# Patient Record
Sex: Male | Born: 1950 | Race: Black or African American | Hispanic: No | Marital: Married | Smoking: Never smoker
Health system: Southern US, Community
[De-identification: ages and names within clinical notes are randomized; demographics above are authoritative.]

## PROBLEM LIST (undated history)

## (undated) DIAGNOSIS — D696 Thrombocytopenia, unspecified: Secondary | ICD-10-CM

## (undated) DIAGNOSIS — N189 Chronic kidney disease, unspecified: Secondary | ICD-10-CM

## (undated) DIAGNOSIS — D509 Iron deficiency anemia, unspecified: Secondary | ICD-10-CM

## (undated) DIAGNOSIS — M069 Rheumatoid arthritis, unspecified: Secondary | ICD-10-CM

## (undated) DIAGNOSIS — C8590 Non-Hodgkin lymphoma, unspecified, unspecified site: Secondary | ICD-10-CM

---

## 2015-06-29 DIAGNOSIS — C9110 Chronic lymphocytic leukemia of B-cell type not having achieved remission: Secondary | ICD-10-CM

## 2015-06-29 HISTORY — DX: Chronic lymphocytic leukemia of B-cell type not having achieved remission: C91.10

## 2019-11-05 DIAGNOSIS — I2699 Other pulmonary embolism without acute cor pulmonale: Secondary | ICD-10-CM

## 2019-11-05 HISTORY — DX: Other pulmonary embolism without acute cor pulmonale: I26.99

## 2019-11-17 ENCOUNTER — Inpatient Hospital Stay (HOSPITAL_COMMUNITY): Payer: Medicare Other

## 2019-11-17 ENCOUNTER — Inpatient Hospital Stay (HOSPITAL_COMMUNITY)
Admission: AD | Admit: 2019-11-17 | Discharge: 2019-11-27 | DRG: 871 | Disposition: E | Payer: Medicare Other | Source: Other Acute Inpatient Hospital | Attending: Pulmonary Disease | Admitting: Pulmonary Disease

## 2019-11-17 DIAGNOSIS — C9110 Chronic lymphocytic leukemia of B-cell type not having achieved remission: Secondary | ICD-10-CM | POA: Diagnosis present

## 2019-11-17 DIAGNOSIS — E8770 Fluid overload, unspecified: Secondary | ICD-10-CM | POA: Diagnosis not present

## 2019-11-17 DIAGNOSIS — I2699 Other pulmonary embolism without acute cor pulmonale: Secondary | ICD-10-CM | POA: Diagnosis present

## 2019-11-17 DIAGNOSIS — U071 COVID-19: Secondary | ICD-10-CM | POA: Diagnosis present

## 2019-11-17 DIAGNOSIS — E875 Hyperkalemia: Secondary | ICD-10-CM | POA: Diagnosis present

## 2019-11-17 DIAGNOSIS — Z4659 Encounter for fitting and adjustment of other gastrointestinal appliance and device: Secondary | ICD-10-CM

## 2019-11-17 DIAGNOSIS — R579 Shock, unspecified: Secondary | ICD-10-CM

## 2019-11-17 DIAGNOSIS — Z66 Do not resuscitate: Secondary | ICD-10-CM | POA: Diagnosis not present

## 2019-11-17 DIAGNOSIS — J80 Acute respiratory distress syndrome: Secondary | ICD-10-CM | POA: Diagnosis present

## 2019-11-17 DIAGNOSIS — M069 Rheumatoid arthritis, unspecified: Secondary | ICD-10-CM | POA: Diagnosis present

## 2019-11-17 DIAGNOSIS — Z978 Presence of other specified devices: Secondary | ICD-10-CM

## 2019-11-17 DIAGNOSIS — N170 Acute kidney failure with tubular necrosis: Secondary | ICD-10-CM | POA: Diagnosis present

## 2019-11-17 DIAGNOSIS — J9601 Acute respiratory failure with hypoxia: Secondary | ICD-10-CM | POA: Diagnosis not present

## 2019-11-17 DIAGNOSIS — J1282 Pneumonia due to coronavirus disease 2019: Secondary | ICD-10-CM

## 2019-11-17 DIAGNOSIS — J9622 Acute and chronic respiratory failure with hypercapnia: Secondary | ICD-10-CM

## 2019-11-17 DIAGNOSIS — A4189 Other specified sepsis: Secondary | ICD-10-CM | POA: Diagnosis present

## 2019-11-17 DIAGNOSIS — D638 Anemia in other chronic diseases classified elsewhere: Secondary | ICD-10-CM | POA: Diagnosis present

## 2019-11-17 DIAGNOSIS — E162 Hypoglycemia, unspecified: Secondary | ICD-10-CM | POA: Diagnosis not present

## 2019-11-17 DIAGNOSIS — N179 Acute kidney failure, unspecified: Secondary | ICD-10-CM

## 2019-11-17 DIAGNOSIS — D696 Thrombocytopenia, unspecified: Secondary | ICD-10-CM | POA: Diagnosis present

## 2019-11-17 DIAGNOSIS — C7951 Secondary malignant neoplasm of bone: Secondary | ICD-10-CM | POA: Diagnosis present

## 2019-11-17 DIAGNOSIS — N189 Chronic kidney disease, unspecified: Secondary | ICD-10-CM | POA: Diagnosis present

## 2019-11-17 DIAGNOSIS — R6521 Severe sepsis with septic shock: Secondary | ICD-10-CM | POA: Diagnosis present

## 2019-11-17 DIAGNOSIS — J9600 Acute respiratory failure, unspecified whether with hypoxia or hypercapnia: Secondary | ICD-10-CM

## 2019-11-17 DIAGNOSIS — Z8572 Personal history of non-Hodgkin lymphomas: Secondary | ICD-10-CM | POA: Diagnosis not present

## 2019-11-17 DIAGNOSIS — E872 Acidosis: Secondary | ICD-10-CM | POA: Diagnosis present

## 2019-11-17 HISTORY — DX: Iron deficiency anemia, unspecified: D50.9

## 2019-11-17 HISTORY — DX: Rheumatoid arthritis, unspecified: M06.9

## 2019-11-17 HISTORY — DX: Non-Hodgkin lymphoma, unspecified, unspecified site: C85.90

## 2019-11-17 HISTORY — DX: Chronic kidney disease, unspecified: N18.9

## 2019-11-17 HISTORY — DX: Thrombocytopenia, unspecified: D69.6

## 2019-11-17 LAB — GLUCOSE, CAPILLARY: Glucose-Capillary: 116 mg/dL — ABNORMAL HIGH (ref 70–99)

## 2019-11-17 MED ORDER — NOREPINEPHRINE 4 MG/250ML-% IV SOLN
0.0000 ug/min | INTRAVENOUS | Status: DC
  Filled 2019-11-17: qty 250

## 2019-11-17 MED ORDER — CHLORHEXIDINE GLUCONATE 0.12% ORAL RINSE (MEDLINE KIT)
15.0000 mL | Freq: Two times a day (BID) | OROMUCOSAL | Status: DC
  Administered 2019-11-17 – 2019-11-19 (×4): 15 mL via OROMUCOSAL

## 2019-11-17 MED ORDER — ORAL CARE MOUTH RINSE
15.0000 mL | OROMUCOSAL | Status: DC
  Administered 2019-11-18 – 2019-11-19 (×18): 15 mL via OROMUCOSAL

## 2019-11-17 MED ORDER — NOREPINEPHRINE 4 MG/250ML-% IV SOLN
INTRAVENOUS | Status: AC
  Administered 2019-11-17: 4 mg via INTRAVENOUS
  Filled 2019-11-17: qty 250

## 2019-11-17 MED ORDER — FENTANYL 2500MCG IN NS 250ML (10MCG/ML) PREMIX INFUSION
0.0000 ug/h | INTRAVENOUS | Status: DC
  Administered 2019-11-18: 50 ug/h via INTRAVENOUS
  Administered 2019-11-19: 100 ug/h via INTRAVENOUS
  Filled 2019-11-17 (×2): qty 250

## 2019-11-17 MED ORDER — EPINEPHRINE HCL 5 MG/250ML IV SOLN IN NS
INTRAVENOUS | Status: AC
  Administered 2019-11-17: 2 ug/min via INTRAVENOUS
  Administered 2019-11-18: 2 ug/min
  Filled 2019-11-17: qty 250

## 2019-11-17 MED ORDER — VASOPRESSIN 20 UNIT/ML IV SOLN
0.0300 [IU]/min | INTRAVENOUS | Status: DC
  Administered 2019-11-18 (×2): 0.03 [IU]/min via INTRAVENOUS
  Filled 2019-11-17 (×3): qty 2

## 2019-11-17 MED ORDER — CHLORHEXIDINE GLUCONATE CLOTH 2 % EX PADS
6.0000 | MEDICATED_PAD | Freq: Every day | CUTANEOUS | Status: DC
  Administered 2019-11-17: 6 via TOPICAL

## 2019-11-17 NOTE — Consult Note (Signed)
Renal Service Consult Note [facility]  Craig Allen 11/08/2019 Craig Allen Requesting Physician:  Dr Craig Allen  Reason for Consult:  Renal failure HPI: The patient is a 69 y.o. year-old w/ hx of RA, CLL admitted to hospital in [place], [place] about 12 days ago for COVID+ pna.  Pt got worse over the last week or so and was moved to ICU. Renal function worsened from creat 1.2 on 5/15 to B/Cr 105 and 5.3 today. Pt had HD x 1 yesterday on 5/22 w/o good results due to shock and hypotension. Pt has been getting IV abx w/ vanc/ cefepime. Pt was transferred to [facility] for CRRT.    Pt is not responsive. No hx obtained from the patient.    UA on 5/15 was mostly negative and the same w/ UA on 5/22 25 wbc and 3-5 rbc.   ROS n/a   Past Medical History No past medical history on file. Past Surgical History  Family History No family history on file. Social History  has no history on file for tobacco, alcohol, and drug. Allergies Not on File Home medications Prior to Admission medications   Not on File     Vitals:   11/02/2019 2302 11/24/2019 2310 10/27/2019 2311 11/12/2019 2327  BP:   117/70   Pulse:   (!) 115   Resp:   (!) 27   Temp:  98.3 F (36.8 C)    TempSrc:  Axillary    SpO2:   90%   Weight:    93.8 kg  Height: 5\' 8"  (1.727 m)      Exam Gen on vent, sedated No rash, cyanosis or gangrene Sclera anicteric, throat w ETT  No jvd or bruits Chest clear bilat to bases, good air movement, no rales or wheezing RRR no MRG, prominent heart sounds Abd soft ntnd no mass or ascites +bs GU normal male w/ foley in place MS no joint effusions or deformity Ext diffuse 2+ pitting edema of the legs and arms, no wounds or ulcers noted Neuro is sedated on the vent    Assessment/ Plan: 1. Acute renal failure - in patient w/ hx of RA and CLL admitted for COVID pna about 12 days ago and has had progressive decline in renal function over the last 1 week. Pt had 1 HD on 5/22 which was not  successful due to shock. Pt on pressors now. CXR's showing pulm edema according to outside notes but we don't have those films available. Pt has AKI , probable ATN. ABG here shows severe acidemia. Plan CRRT will use bicarb pre/post fluids for now. Will plan to try to remove volume 50-75 cc/hr to start, as tolerated. Pt on IV heparin gtt.  2. COVID-19 PNA/ resp failure - on vent per CCM 3. Vol overload 4. H/o RA 5. H/o CLL      Craig Allen Lad  MD 10/28/2019, 11:56 PM  No results for input(s): WBC, HGB in the last 168 hours. No results for input(s): K, BUN, CREATININE, CALCIUM, PHOS in the last 168 hours.

## 2019-11-17 NOTE — Progress Notes (Signed)
eLink Physician-Brief Progress Note Patient Name: Craig Allen DOB: 10/10/1950 MRN: [id number]   Date of Service  11/14/2019  HPI/Events of Note  69 yo male transferred from [place], [place] with COVID pneumonia, sepsis, hypotension and renal failure for CRRT. Nursing has already ordered a portable CXR STAT.   eICU Interventions  Will order: 1. Ventilator settings: 100%PRVC 20/TV 540/P 12. 2. ABG now.  3. Fentanyl IV infusion. Titrate to RASS = 0 to -1.  4. Norepinephrine IV infusion. Titrate to MAP >= 65. 5. Vasopressin IV infusion at shock dose.      Intervention Category Evaluation Type: New Patient Evaluation  Lysle Dingwall 11/18/2019, 11:47 PM

## 2019-11-17 NOTE — Progress Notes (Signed)
Report taken from [facility] from [place], [place].  Wifes name is Esmael Zapien (c) [phone]. Unknown ETA.

## 2019-11-18 ENCOUNTER — Inpatient Hospital Stay (HOSPITAL_COMMUNITY): Payer: Medicare Other

## 2019-11-18 ENCOUNTER — Other Ambulatory Visit: Payer: Self-pay

## 2019-11-18 ENCOUNTER — Encounter (HOSPITAL_COMMUNITY): Payer: Self-pay | Admitting: Emergency Medicine

## 2019-11-18 DIAGNOSIS — R6521 Severe sepsis with septic shock: Secondary | ICD-10-CM

## 2019-11-18 DIAGNOSIS — U071 COVID-19: Secondary | ICD-10-CM | POA: Diagnosis present

## 2019-11-18 DIAGNOSIS — N179 Acute kidney failure, unspecified: Secondary | ICD-10-CM

## 2019-11-18 DIAGNOSIS — J1282 Pneumonia due to coronavirus disease 2019: Secondary | ICD-10-CM

## 2019-11-18 DIAGNOSIS — R579 Shock, unspecified: Secondary | ICD-10-CM

## 2019-11-18 DIAGNOSIS — J9601 Acute respiratory failure with hypoxia: Secondary | ICD-10-CM

## 2019-11-18 DIAGNOSIS — N170 Acute kidney failure with tubular necrosis: Secondary | ICD-10-CM

## 2019-11-18 LAB — POCT I-STAT 7, (LYTES, BLD GAS, ICA,H+H)
Acid-base deficit: 13 mmol/L — ABNORMAL HIGH (ref 0.0–2.0)
Acid-base deficit: 14 mmol/L — ABNORMAL HIGH (ref 0.0–2.0)
Acid-base deficit: 3 mmol/L — ABNORMAL HIGH (ref 0.0–2.0)
Acid-base deficit: 4 mmol/L — ABNORMAL HIGH (ref 0.0–2.0)
Acid-base deficit: 8 mmol/L — ABNORMAL HIGH (ref 0.0–2.0)
Bicarbonate: 15.6 mmol/L — ABNORMAL LOW (ref 20.0–28.0)
Bicarbonate: 16.6 mmol/L — ABNORMAL LOW (ref 20.0–28.0)
Bicarbonate: 20.3 mmol/L (ref 20.0–28.0)
Bicarbonate: 23.6 mmol/L (ref 20.0–28.0)
Bicarbonate: 24.8 mmol/L (ref 20.0–28.0)
Calcium, Ion: 0.95 mmol/L — ABNORMAL LOW (ref 1.15–1.40)
Calcium, Ion: 0.95 mmol/L — ABNORMAL LOW (ref 1.15–1.40)
Calcium, Ion: 0.98 mmol/L — ABNORMAL LOW (ref 1.15–1.40)
Calcium, Ion: 1.04 mmol/L — ABNORMAL LOW (ref 1.15–1.40)
Calcium, Ion: 1.05 mmol/L — ABNORMAL LOW (ref 1.15–1.40)
HCT: 27 % — ABNORMAL LOW (ref 39.0–52.0)
HCT: 27 % — ABNORMAL LOW (ref 39.0–52.0)
HCT: 28 % — ABNORMAL LOW (ref 39.0–52.0)
HCT: 28 % — ABNORMAL LOW (ref 39.0–52.0)
HCT: 30 % — ABNORMAL LOW (ref 39.0–52.0)
Hemoglobin: 10.2 g/dL — ABNORMAL LOW (ref 13.0–17.0)
Hemoglobin: 9.2 g/dL — ABNORMAL LOW (ref 13.0–17.0)
Hemoglobin: 9.2 g/dL — ABNORMAL LOW (ref 13.0–17.0)
Hemoglobin: 9.5 g/dL — ABNORMAL LOW (ref 13.0–17.0)
Hemoglobin: 9.5 g/dL — ABNORMAL LOW (ref 13.0–17.0)
O2 Saturation: 67 %
O2 Saturation: 87 %
O2 Saturation: 91 %
O2 Saturation: 93 %
O2 Saturation: 94 %
Patient temperature: 36.2
Patient temperature: 37.7
Patient temperature: 98.3
Patient temperature: 98.3
Patient temperature: 98.4
Potassium: 5.1 mmol/L (ref 3.5–5.1)
Potassium: 5.3 mmol/L — ABNORMAL HIGH (ref 3.5–5.1)
Potassium: 5.4 mmol/L — ABNORMAL HIGH (ref 3.5–5.1)
Potassium: 5.5 mmol/L — ABNORMAL HIGH (ref 3.5–5.1)
Potassium: 5.9 mmol/L — ABNORMAL HIGH (ref 3.5–5.1)
Sodium: 132 mmol/L — ABNORMAL LOW (ref 135–145)
Sodium: 134 mmol/L — ABNORMAL LOW (ref 135–145)
Sodium: 134 mmol/L — ABNORMAL LOW (ref 135–145)
Sodium: 135 mmol/L (ref 135–145)
Sodium: 136 mmol/L (ref 135–145)
TCO2: 17 mmol/L — ABNORMAL LOW (ref 22–32)
TCO2: 18 mmol/L — ABNORMAL LOW (ref 22–32)
TCO2: 22 mmol/L (ref 22–32)
TCO2: 25 mmol/L (ref 22–32)
TCO2: 27 mmol/L (ref 22–32)
pCO2 arterial: 47.5 mmHg (ref 32.0–48.0)
pCO2 arterial: 53 mmHg — ABNORMAL HIGH (ref 32.0–48.0)
pCO2 arterial: 53 mmHg — ABNORMAL HIGH (ref 32.0–48.0)
pCO2 arterial: 61.9 mmHg — ABNORMAL HIGH (ref 32.0–48.0)
pCO2 arterial: 63.4 mmHg — ABNORMAL HIGH (ref 32.0–48.0)
pH, Arterial: 7.042 — CL (ref 7.350–7.450)
pH, Arterial: 7.074 — CL (ref 7.350–7.450)
pH, Arterial: 7.189 — CL (ref 7.350–7.450)
pH, Arterial: 7.2 — ABNORMAL LOW (ref 7.350–7.450)
pH, Arterial: 7.299 — ABNORMAL LOW (ref 7.350–7.450)
pO2, Arterial: 52 mmHg — ABNORMAL LOW (ref 83.0–108.0)
pO2, Arterial: 73 mmHg — ABNORMAL LOW (ref 83.0–108.0)
pO2, Arterial: 74 mmHg — ABNORMAL LOW (ref 83.0–108.0)
pO2, Arterial: 75 mmHg — ABNORMAL LOW (ref 83.0–108.0)
pO2, Arterial: 81 mmHg — ABNORMAL LOW (ref 83.0–108.0)

## 2019-11-18 LAB — RENAL FUNCTION PANEL
Albumin: 2.1 g/dL — ABNORMAL LOW (ref 3.5–5.0)
Albumin: 2.2 g/dL — ABNORMAL LOW (ref 3.5–5.0)
Anion gap: 22 — ABNORMAL HIGH (ref 5–15)
Anion gap: 23 — ABNORMAL HIGH (ref 5–15)
BUN: 83 mg/dL — ABNORMAL HIGH (ref 8–23)
BUN: 65 mg/dL — ABNORMAL HIGH (ref 8–23)
CO2: 19 mmol/L — ABNORMAL LOW (ref 22–32)
CO2: 20 mmol/L — ABNORMAL LOW (ref 22–32)
Calcium: 6.7 mg/dL — ABNORMAL LOW (ref 8.9–10.3)
Calcium: 6.6 mg/dL — ABNORMAL LOW (ref 8.9–10.3)
Chloride: 103 mmol/L (ref 98–111)
Chloride: 98 mmol/L (ref 98–111)
Creatinine, Ser: 4.4 mg/dL — ABNORMAL HIGH (ref 0.61–1.24)
Creatinine, Ser: 3.44 mg/dL — ABNORMAL HIGH (ref 0.61–1.24)
GFR calc Af Amer: 15 mL/min — ABNORMAL LOW (ref >60)
GFR calc Af Amer: 20 mL/min — ABNORMAL LOW (ref >60)
GFR calc non Af Amer: 13 mL/min — ABNORMAL LOW (ref >60)
GFR calc non Af Amer: 17 mL/min — ABNORMAL LOW (ref >60)
Glucose, Bld: 153 mg/dL — ABNORMAL HIGH (ref 70–99)
Glucose, Bld: 165 mg/dL — ABNORMAL HIGH (ref 70–99)
Phosphorus: 7.6 mg/dL — ABNORMAL HIGH (ref 2.5–4.6)
Phosphorus: 6.5 mg/dL — ABNORMAL HIGH (ref 2.5–4.6)
Potassium: 5.6 mmol/L — ABNORMAL HIGH (ref 3.5–5.1)
Potassium: 5.3 mmol/L — ABNORMAL HIGH (ref 3.5–5.1)
Sodium: 144 mmol/L (ref 135–145)
Sodium: 141 mmol/L (ref 135–145)

## 2019-11-18 LAB — CBC
HCT: 25.2 % — ABNORMAL LOW (ref 39.0–52.0)
Hemoglobin: 8.4 g/dL — ABNORMAL LOW (ref 13.0–17.0)
MCH: 28.8 pg (ref 26.0–34.0)
MCHC: 33.3 g/dL (ref 30.0–36.0)
MCV: 86.3 fL (ref 80.0–100.0)
Platelets: 82 10*3/uL — ABNORMAL LOW (ref 150–400)
RBC: 2.92 MIL/uL — ABNORMAL LOW (ref 4.22–5.81)
RDW: 16.2 % — ABNORMAL HIGH (ref 11.5–15.5)
WBC: 27.9 10*3/uL — ABNORMAL HIGH (ref 4.0–10.5)
nRBC: 0.6 % — ABNORMAL HIGH (ref 0.0–0.2)

## 2019-11-18 LAB — GLUCOSE, CAPILLARY
Glucose-Capillary: 136 mg/dL — ABNORMAL HIGH (ref 70–99)
Glucose-Capillary: 147 mg/dL — ABNORMAL HIGH (ref 70–99)
Glucose-Capillary: 147 mg/dL — ABNORMAL HIGH (ref 70–99)
Glucose-Capillary: 149 mg/dL — ABNORMAL HIGH (ref 70–99)
Glucose-Capillary: 149 mg/dL — ABNORMAL HIGH (ref 70–99)
Glucose-Capillary: 149 mg/dL — ABNORMAL HIGH (ref 70–99)
Glucose-Capillary: 156 mg/dL — ABNORMAL HIGH (ref 70–99)
Glucose-Capillary: 70 mg/dL (ref 70–99)

## 2019-11-18 LAB — URINALYSIS, ROUTINE W REFLEX MICROSCOPIC
Bilirubin Urine: NEGATIVE
Glucose, UA: 100 mg/dL — AB
Ketones, ur: 15 mg/dL — AB
Leukocytes,Ua: NEGATIVE
Nitrite: NEGATIVE
Protein, ur: >300 mg/dL — AB
Specific Gravity, Urine: >1.03 — ABNORMAL HIGH (ref 1.005–1.030)
pH: 5 (ref 5.0–8.0)

## 2019-11-18 LAB — MRSA PCR SCREENING: MRSA by PCR: NEGATIVE

## 2019-11-18 LAB — CREATININE, URINE, RANDOM: Creatinine, Urine: 143.32 mg/dL

## 2019-11-18 LAB — APTT
aPTT: 87 seconds — ABNORMAL HIGH (ref 24–36)
aPTT: 93 seconds — ABNORMAL HIGH (ref 24–36)
aPTT: 78 seconds — ABNORMAL HIGH (ref 24–36)
aPTT: 73 seconds — ABNORMAL HIGH (ref 24–36)

## 2019-11-18 LAB — LACTIC ACID, PLASMA: Lactic Acid, Venous: 7.4 mmol/L (ref 0.5–1.9)

## 2019-11-18 LAB — URINALYSIS, MICROSCOPIC (REFLEX): WBC, UA: NONE SEEN WBC/hpf (ref 0–5)

## 2019-11-18 LAB — ECHOCARDIOGRAM COMPLETE
Height: 68 in
Weight: 3308.66 oz

## 2019-11-18 LAB — SODIUM, URINE, RANDOM: Sodium, Ur: 44 mmol/L

## 2019-11-18 LAB — HEMOGLOBIN A1C
Hgb A1c MFr Bld: 6.8 % — ABNORMAL HIGH (ref 4.8–5.6)
Mean Plasma Glucose: 148.46 mg/dL

## 2019-11-18 LAB — VANCOMYCIN, RANDOM: Vancomycin Rm: 10

## 2019-11-18 LAB — HEPARIN LEVEL (UNFRACTIONATED): Heparin Unfractionated: 0.6 IU/mL (ref 0.30–0.70)

## 2019-11-18 LAB — MAGNESIUM: Magnesium: 1.8 mg/dL (ref 1.7–2.4)

## 2019-11-18 LAB — CK: Total CK: 258 U/L (ref 49–397)

## 2019-11-18 LAB — BRAIN NATRIURETIC PEPTIDE: B Natriuretic Peptide: 62 pg/mL (ref 0.0–100.0)

## 2019-11-18 MED ORDER — STERILE WATER FOR INJECTION IV SOLN
INTRAVENOUS | Status: DC
  Filled 2019-11-18 (×12): qty 150

## 2019-11-18 MED ORDER — SODIUM CHLORIDE 0.9% FLUSH: 3.0000 mL | INTRAVENOUS | Status: DC | PRN

## 2019-11-18 MED ORDER — CHLORHEXIDINE GLUCONATE CLOTH 2 % EX PADS: 6.0000 | MEDICATED_PAD | Freq: Every day | CUTANEOUS | Status: DC

## 2019-11-18 MED ORDER — EPINEPHRINE HCL 5 MG/250ML IV SOLN IN NS: 0.5000 ug/min | INTRAVENOUS | Status: DC

## 2019-11-18 MED ORDER — STERILE WATER FOR INJECTION IV SOLN
150.0000 mL/h | INTRAVENOUS | Status: DC
  Administered 2019-11-18 (×2): 150 mL/h via INTRAVENOUS_CENTRAL
  Filled 2019-11-18 (×7): qty 150

## 2019-11-18 MED ORDER — MIDAZOLAM 50MG/50ML (1MG/ML) PREMIX INFUSION
0.0000 mg/h | INTRAVENOUS | Status: DC
  Administered 2019-11-18: 5 mg/h via INTRAVENOUS
  Administered 2019-11-18 – 2019-11-19 (×3): 6 mg/h via INTRAVENOUS
  Filled 2019-11-18 (×4): qty 50

## 2019-11-18 MED ORDER — ARGATROBAN 50 MG/50ML IV SOLN
0.4000 ug/kg/min | INTRAVENOUS | Status: DC
  Administered 2019-11-18 (×2): 0.5 ug/kg/min via INTRAVENOUS
  Administered 2019-11-19: 0.4 ug/kg/min via INTRAVENOUS
  Filled 2019-11-18 (×3): qty 50

## 2019-11-18 MED ORDER — SODIUM BICARBONATE 8.4 % IV SOLN
INTRAVENOUS | Status: AC
  Administered 2019-11-18: 100 meq via INTRAVENOUS
  Filled 2019-11-18: qty 100

## 2019-11-18 MED ORDER — SODIUM CHLORIDE 0.9 % IV SOLN
0.0000 ug/kg/min | INTRAVENOUS | Status: DC
  Administered 2019-11-18: 5 ug/kg/min via INTRAVENOUS
  Administered 2019-11-18: 4 ug/kg/min via INTRAVENOUS
  Administered 2019-11-19 (×3): 7 ug/kg/min via INTRAVENOUS
  Filled 2019-11-18 (×9): qty 20

## 2019-11-18 MED ORDER — STERILE WATER FOR INJECTION IV SOLN
INTRAVENOUS | Status: DC
  Filled 2019-11-18 (×6): qty 150

## 2019-11-18 MED ORDER — CISATRACURIUM BOLUS VIA INFUSION
0.1000 mg/kg | Freq: Once | INTRAVENOUS | Status: AC
  Administered 2019-11-18: 9.4 mg via INTRAVENOUS
  Filled 2019-11-18: qty 10

## 2019-11-18 MED ORDER — STERILE WATER FOR INJECTION IV SOLN
INTRAVENOUS | Status: DC
  Filled 2019-11-18 (×11): qty 150

## 2019-11-18 MED ORDER — SODIUM BICARBONATE 8.4 % IV SOLN: 100.0000 meq | Freq: Once | INTRAVENOUS | Status: AC

## 2019-11-18 MED ORDER — HEPARIN SODIUM (PORCINE) 1000 UNIT/ML DIALYSIS: 1000.0000 [IU] | INTRAMUSCULAR | Status: DC | PRN

## 2019-11-18 MED ORDER — PRISMASOL BGK 4/2.5 32-4-2.5 MEQ/L IV SOLN
INTRAVENOUS | Status: DC
  Filled 2019-11-18 (×28): qty 5000

## 2019-11-18 MED ORDER — SODIUM CHLORIDE 0.9 % FOR CRRT: INTRAVENOUS_CENTRAL | Status: DC | PRN

## 2019-11-18 MED ORDER — DEXTROSE 50 % IV SOLN
INTRAVENOUS | Status: AC
  Administered 2019-11-18: 50 mL
  Filled 2019-11-18: qty 50

## 2019-11-18 MED ORDER — HEPARIN (PORCINE) 25000 UT/250ML-% IV SOLN
800.0000 [IU]/h | INTRAVENOUS | Status: DC
  Administered 2019-11-18: 950 [IU]/h via INTRAVENOUS
  Filled 2019-11-18: qty 250

## 2019-11-18 MED ORDER — SODIUM CHLORIDE 0.9% FLUSH
3.0000 mL | Freq: Two times a day (BID) | INTRAVENOUS | Status: DC
  Administered 2019-11-18 – 2019-11-19 (×2): 3 mL via INTRAVENOUS

## 2019-11-18 MED ORDER — NOREPINEPHRINE 16 MG/250ML-% IV SOLN
0.0000 ug/min | INTRAVENOUS | Status: DC
  Administered 2019-11-18: 35 ug/min via INTRAVENOUS
  Administered 2019-11-18: 40 ug/min via INTRAVENOUS
  Administered 2019-11-18: 30 ug/min via INTRAVENOUS
  Administered 2019-11-19 (×2): 40 ug/min via INTRAVENOUS
  Filled 2019-11-18 (×6): qty 250

## 2019-11-18 MED ORDER — ARTIFICIAL TEARS OPHTHALMIC OINT
1.0000 "application " | TOPICAL_OINTMENT | Freq: Three times a day (TID) | OPHTHALMIC | Status: DC
  Administered 2019-11-18 – 2019-11-19 (×6): 1 via OPHTHALMIC
  Filled 2019-11-18: qty 3.5

## 2019-11-18 MED ORDER — DOCUSATE SODIUM 50 MG/5ML PO LIQD
100.0000 mg | Freq: Two times a day (BID) | ORAL | Status: DC
  Administered 2019-11-18 (×3): 100 mg via ORAL
  Filled 2019-11-18 (×3): qty 10

## 2019-11-18 MED ORDER — ALTEPLASE 2 MG IJ SOLR: 2.0000 mg | Freq: Once | INTRAMUSCULAR | Status: DC | PRN

## 2019-11-18 MED ORDER — SODIUM BICARBONATE 8.4 % IV SOLN
100.0000 meq | Freq: Once | INTRAVENOUS | Status: AC
  Administered 2019-11-18: 100 meq via INTRAVENOUS
  Filled 2019-11-18: qty 100

## 2019-11-18 MED ORDER — PANTOPRAZOLE SODIUM 40 MG IV SOLR
40.0000 mg | Freq: Every day | INTRAVENOUS | Status: DC
  Administered 2019-11-18 (×2): 40 mg via INTRAVENOUS
  Filled 2019-11-18 (×2): qty 40

## 2019-11-18 MED ORDER — INSULIN ASPART 100 UNIT/ML ~~LOC~~ SOLN: 0.0000 [IU] | SUBCUTANEOUS | Status: DC

## 2019-11-18 MED ORDER — SODIUM CHLORIDE 0.9 % IV SOLN: 250.0000 mL | INTRAVENOUS | Status: DC | PRN

## 2019-11-18 MED ORDER — POLYETHYLENE GLYCOL 3350 17 G PO PACK
17.0000 g | PACK | Freq: Every day | ORAL | Status: DC
  Administered 2019-11-18: 17 g via ORAL
  Filled 2019-11-18: qty 1

## 2019-11-18 MED ORDER — EPINEPHRINE HCL 5 MG/250ML IV SOLN IN NS
0.5000 ug/min | INTRAVENOUS | Status: DC
  Administered 2019-11-18: 18 ug/min via INTRAVENOUS
  Administered 2019-11-18: 14 ug/min via INTRAVENOUS
  Administered 2019-11-19: 20 ug/min via INTRAVENOUS
  Administered 2019-11-19: 17 ug/min via INTRAVENOUS
  Administered 2019-11-19: 20 ug/min via INTRAVENOUS
  Administered 2019-11-19: 10 ug/min via INTRAVENOUS
  Filled 2019-11-18 (×5): qty 250

## 2019-11-18 MED ORDER — SODIUM CHLORIDE 0.9 % IV SOLN
2.0000 g | Freq: Two times a day (BID) | INTRAVENOUS | Status: DC
  Administered 2019-11-18 – 2019-11-19 (×4): 2 g via INTRAVENOUS
  Filled 2019-11-18 (×4): qty 2

## 2019-11-18 NOTE — Progress Notes (Addendum)
ANTICOAGULATION CONSULT NOTE - Initial Consult  Pharmacy Consult for Argatroban (discontinuing IV Heparin Indication: pulmonary embolus - suspected HIT  Allergies  Allergen Reactions  . Heparin     Suspected HIT - labs inprocess    Patient Measurements: Height: 5' 8"  (172.7 cm) Weight: 93.8 kg (206 lb 12.7 oz) IBW/kg (Calculated) : 68.4 Heparin Dosing Weight: 88 kg   Vital Signs: Temp: 97.2 F (36.2 C) (05/23 0915) Temp Source: Core (05/23 0800) BP: 114/78 (05/23 0915) Pulse Rate: 104 (05/23 0915)  Labs: Recent Labs    11/18/19 0349 11/18/19 0349 11/18/19 0453 11/18/19 0557  HGB 9.5*   < > 8.4* 9.2*  HCT 28.0*  --  25.2* 27.0*  PLT  --   --  82*  --   APTT  --   --  87*  --   HEPARINUNFRC  --   --  0.60  --   CREATININE  --   --  4.40*  --    < > = values in this interval not displayed.    Estimated Creatinine Clearance: 17.6 mL/min (A) (by C-G formula based on SCr of 4.4 mg/dL (H)).   Medical History: History reviewed. No pertinent past medical history.  Medications:  Scheduled:  . artificial tears  1 application Both Eyes H4V  . chlorhexidine gluconate (MEDLINE KIT)  15 mL Mouth Rinse BID  . [START ON Dec 04, 2019] Chlorhexidine Gluconate Cloth  6 each Topical Q0600  . docusate  100 mg Oral BID  . insulin aspart  0-6 Units Subcutaneous Q4H  . mouth rinse  15 mL Mouth Rinse 10 times per day  . pantoprazole (PROTONIX) IV  40 mg Intravenous QHS  . polyethylene glycol  17 g Oral Daily  . sodium chloride flush  3 mL Intravenous Q12H    Assessment: 39 yom transferred from [facility] for CRRT initiation after found to have COVID 19 and acute, non-occlusive PE of LUL. Was started on apixaban (LD 5/18) then transitioned to heparin infusion (running at 800 units/hr on arrival).    Platelets down to 82 today. (Plts of 256 on 5/18 when heparin was restarted at [facility]. Patient was exposed to IV Heparin 5/9 to 5/11 then changed to Eliquis (last dose 5/18) then  back to IV Heparin on 5/18 per [facility] notes. Concern for possible HIT - labs sent and changing to Argatroban.   Goal of Therapy:  aPTT 50-90 seconds Monitor platelets by anticoagulation protocol: Yes   Plan:  Discontinue IV Heparin.  Start Argatroban at 0.5 mcg/kg/min APTT in 2 hours.    Sloan Leiter, PharmD, BCPS, BCCCP Clinical Pharmacist Please refer to [facility] for [place] numbers 11/18/2019 9:31 AM  Addendum:  APTT 93 on Argatroban 0.5 mcg/kg/min.   Plan: Reduce Argatroban to 0.4 mcg/kg/min.  Recheck aPTT in 2 hours.   Sloan Leiter, PharmD, BCPS, BCCCP Clinical Pharmacist 11/18/2019, 1:40 PM

## 2019-11-18 NOTE — Progress Notes (Signed)
CRITICAL VALUE ALERT  Critical Value:  Lactic acid=7.4  Date & Time Notied:  11/18/19  0230  Provider Notified: elink  Orders Received/Actions taken: awaiting new orders

## 2019-11-18 NOTE — Progress Notes (Signed)
ANTICOAGULATION CONSULT NOTE - Initial Consult  Pharmacy Consult for heparin Indication: pulmonary embolus  Not on File  Patient Measurements: Height: _0  (172.7 cm) Weight: 93.8 kg (206 lb 12.7 oz) IBW/kg (Calculated) : 68.4 Heparin Dosing Weight: 88 kg   Vital Signs: Temp: 99.3 F (37.4 C) (05/23 0000) Temp Source: Bladder (05/23 0000) BP: 134/67 (05/23 0000) Pulse Rate: 118 (05/23 0000)  Labs: Recent Labs    11/18/19 0004  HGB 9.5*  HCT 28.0*    CrCl cannot be calculated (No successful lab value found.).   Medical History: No past medical history on file.  Medications:  Scheduled:  . artificial tears  1 application Both Eyes A4S  . chlorhexidine gluconate (MEDLINE KIT)  15 mL Mouth Rinse BID  . Chlorhexidine Gluconate Cloth  6 each Topical Q0600  . cisatracurium  0.1 mg/kg Intravenous Once  . docusate  100 mg Oral BID  . mouth rinse  15 mL Mouth Rinse 10 times per day  . polyethylene glycol  17 g Oral Daily    Assessment: 69 yom transferred from [facility] for CRRT initiation after found to have COVID 19 and acute, non-occlusive PE of LUL. Was started on apixaban (LD 5/18) then transitioned to heparin infusion (running at 800 units/hr on arrival).   Initial heparin level here is 0.6, aPTT is 87 - currently correlating. Hgb 9.5. No s/sx of bleeding or infusion issues per nursing.   Goal of Therapy:  Heparin level 0.3-0.7 units/ml aPTT 66-102 seconds Monitor platelets by anticoagulation protocol: Yes   Plan:  Continue heparin infusion at 800 units/hr  Order heparin level in 8 hours  Monitor daily HL, CBC, and for s/sx of bleeding  Antonietta Jewel, PharmD, BCCCP Clinical Pharmacist  11/18/2019 12:50 AM  Please check AMION for all [place] phone numbers After 10:00 PM, call [place] [phone]

## 2019-11-18 NOTE — Progress Notes (Signed)
RT attempted x 2 to place a radial A-line.  Good blood flow obtained both times but unable to tread the catheter.  MD notified.

## 2019-11-18 NOTE — Progress Notes (Signed)
eLink Physician-Brief Progress Note Patient Name: Craig Allen DOB: 10/10/1950 MRN: [id number]   Date of Service  11/18/2019  HPI/Events of Note  ABG on 100%/PRVC 35/TV 540/P 12 = 7.18/53.0/75.0  eICU Interventions  Will order: 1. NaHCO3 100 meq IV now x 1.  2. Repeat ABG at 6 AM.      Intervention Category Major Interventions: Acid-Base disturbance - evaluation and management;Respiratory failure - evaluation and management  Lysle Dingwall 11/18/2019, 4:17 AM

## 2019-11-18 NOTE — Progress Notes (Addendum)
[facility] Progress Note  Subjective: did not do well overnight, unable to UF fluid to due worsening shock/ hypotension  Vitals:   11/18/19 1715 11/18/19 1730 11/18/19 1745 11/18/19 1800  BP: 119/82 112/76 111/80 106/73  Pulse:  (!) 104 (!) 103 (!) 101  Resp: (!) 35 (!) 35 (!) 35 (!) 35  Temp: (!) 97.5 F (36.4 C) (!) 97.5 F (36.4 C) (!) 97.5 F (36.4 C) 97.7 F (36.5 C)  TempSrc:      SpO2:  94% 90% 91%  Weight:      Height:        Exam: Gen on vent, sedated No rash, cyanosis or gangrene Sclera anicteric, throat w ETT  No jvd or bruits Chest clear bilat to bases, good air movement, no rales or wheezing RRR no MRG, prominent heart sounds Abd soft ntnd no mass or ascites +bs GU normal male w/ foley in place MS no joint effusions or deformity Ext diffuse 2+ pitting edema of the legs and arms, no wounds or ulcers noted Neuro is sedated on the vent  UA 5/23 - >300 prot, large Hb, no wbc, 0-5 rbc  Assessment/ Plan: 1. Acute renal failure - baseline creat around 1.2.  Pt w/ hx of RA and CLL admitted for COVID pna about 12 days ago at outside hospital and has had progressive decline in renal function over the last week. SP 1 HD session there on 5/22 which was not successful due to shock. Pt deteriorated and was started on pressors and tx'd to [facility] 43M ICU.  CXR here showed diffuse infiltrates. ATN suspected as cause of AKI. Pt had severe acidemia so was started on CRRT early am on 5/23. Pt continued to deteriorate overnight w/ addition of multiple pressors and pt was made DNR. We are not able to pull any fluid, I/O's +. Poor prognosis. Cont CRRT for now. Check CPK.  2. COVID-19 PNA/ resp failure - on vent per CCM 3. Vol overload 4. H/o RA 5. H/o CLL      Rob Shyanna Klingel 11/18/2019, 6:23 PM   Recent Labs  Lab 11/18/19 0453 11/18/19 0453 11/18/19 0557 11/18/19 0557 11/18/19 1548 11/18/19 1631  K 5.6*   < > 5.3*   < > 5.3* 5.1  BUN 83*  --   --   --   65*  --   CREATININE 4.40*  --   --   --  3.44*  --   CALCIUM 6.7*  --   --   --  6.6*  --   PHOS 7.6*  --   --   --  6.5*  --   HGB 8.4*   < > 9.2*  --   --  10.2*   < > = values in this interval not displayed.   Inpatient medications: . artificial tears  1 application Both Eyes K2I  . chlorhexidine gluconate (MEDLINE KIT)  15 mL Mouth Rinse BID  . [START ON 12-11-19] Chlorhexidine Gluconate Cloth  6 each Topical Q0600  . docusate  100 mg Oral BID  . insulin aspart  0-6 Units Subcutaneous Q4H  . mouth rinse  15 mL Mouth Rinse 10 times per day  . pantoprazole (PROTONIX) IV  40 mg Intravenous QHS  . polyethylene glycol  17 g Oral Daily  . sodium chloride flush  3 mL Intravenous Q12H   . sodium chloride    . argatroban 0.4 mcg/kg/min (11/18/19 1600)  . ceFEPime (MAXIPIME) IV Stopped (11/18/19 1349)  .  cisatracurium (NIMBEX) infusion 5 mcg/kg/min (11/18/19 1800)  . epinephrine Stopped (11/18/19 1600)  . fentaNYL infusion INTRAVENOUS 100 mcg/hr (11/18/19 1800)  . midazolam 6 mg/hr (11/18/19 1800)  . norepinephrine (LEVOPHED) Adult infusion 35 mcg/min (11/18/19 1817)  . prismasol BGK 4/2.5 2,200 mL/hr at 11/18/19 1554  . sodium bicarbonate (isotonic) 1000 mL infusion 150 mL/hr (11/18/19 0717)  . sodium bicarbonate (isotonic) 1000 mL infusion 150 mL/hr at 11/18/19 1311  . vasopressin (PITRESSIN) infusion - *FOR SHOCK* 0.03 Units/min (11/18/19 1817)   sodium chloride, alteplase, heparin, sodium chloride, sodium chloride flush

## 2019-11-18 NOTE — Progress Notes (Signed)
  Echocardiogram 2D Echocardiogram has been performed.  Michiel Cowboy 11/18/2019, 9:29 AM

## 2019-11-18 NOTE — Progress Notes (Signed)
Assisted tele visit to patient with family member.  Aidyn Kellis M, RN  

## 2019-11-18 NOTE — Progress Notes (Signed)
eLink Physician-Brief Progress Note Patient Name: Craig Allen DOB: 10/10/1950 MRN: [id number]   Date of Service  11/18/2019  HPI/Events of Note  Request to stick feet for blood cultures.   eICU Interventions  Will order: 1. May stick feet.      Intervention Category Major Interventions: Other:  Elanah Osmanovic Cornelia Copa 11/18/2019, 1:25 AM

## 2019-11-18 NOTE — Progress Notes (Addendum)
NAME:  Craig Allen, MRN:  [id number], DOB:  10/10/1950, LOS: 1 ADMISSION DATE:  11/05/2019, CONSULTATION DATE:  11/18/19 REFERRING MD:  Angelina Sheriff, CHIEF COMPLAINT:  Covid-19 PNA  Brief History   69 y.o. M transferred from [place], [place] with Covid-19 PNA, PE and renal failure. Pt had worsening sepsis and hypotension, so was transferred for CRRT  History of present illness   69 y.o. M with PMH of CLL on Ibrutinib, lymphoma, PE and anemia who presented to [facility] in [place], [place] for cough. Found to have Covid-19 as well as acute, non-occlusive PE of the LUL.  He was admitted and given Dexamethasone, Ceftriaxone and Azithromycin and apixaban.  His respiratory status continued to worsen and he was intubated on 5/18 and transferred to intensive care.  Pt's renal function also worsened and he was initiated on dialysis on 5/21, he became increasingly hypotensive and so transfer was requested for CRRT.  At some point was started on Vanc/Cefepime.   Pt arrived on Levophed, Vasopressin and Epinephrin.  Initial ABG 7.07/53/73/15 0.6  Past Medical History  CLL, Lymphoma, PE, anemia, RA  Significant Hospital Events   5/10 Presented to ED in [place], [place] 5/18 Intubated 5/21 HD initiated  5/22 Transfer to [place]:  nephrology  Procedures:  5/18 ETT 5/21 HD catheter  Significant Diagnostic Tests:  5/20 CTA chest>> nonocclusive PE LUL  Micro Data:  5/23 blood cultures x2>> 5/23 respiratory culture>>  Antimicrobials:  Azithromycin 5/10 >> 5/12 Ceftriaxone 5/10 >> 5/14  Vancomycin 5/18 >> 5/23 Cefepime 5/18 >>   Interim history/subjective:   CVVH running, I/O -75cc Fentanyl 75, Versed 5, norepinephrine 40, epi gtt, vasopressin 0.03, epinephrine drip off 1.00, PEEP 12  Objective   Blood pressure 118/66, pulse (!) 102, temperature (!) 97.2 F (36.2 C), resp. rate (!) 35, height 5\' 8"  (1.727 m), weight 93.8 kg, SpO2 100 %.    Vent Mode: PRVC FiO2 (%):  [100 %] 100 % Set  Rate:  [20 bmp-35 bmp] 35 bmp Vt Set:  [540 mL] 540 mL PEEP:  [12 cmH20] 12 cmH20 Plateau Pressure:  [35 cmH20] 35 cmH20   Intake/Output Summary (Last 24 hours) at 11/18/2019 [id number] Last data filed at 11/18/2019 0600 Gross per 24 hour  Intake 793.96 ml  Output 869 ml  Net -75.04 ml   Filed Weights   10/27/2019 2327 11/18/19 0415  Weight: 93.8 kg 93.8 kg    General: Well-nourished male, sedated and intubated HEENT: MM pink/moist, ETT in place Neuro: Pupils 3 mm and responsive to light, breathing over the vent, unresponsive to pain CV: s1s2 regular rate and rhythm, no m/r/g PULM: Coarse breath sounds throughout, tachypneic dyssynchronous with the vent GI: soft, bsx4 active  Extremities: warm/dry, no edema  Skin: no rashes or lesions   Resolved Hospital Problem list     Assessment & Plan:   Acute hypoxic respiratory failure secondary to Covid-19 pneumonia/ARDS Completed dexamethasone 10 days, never received remdesivir Decompensating clinical status since admission on 5/10 now with significant acidosis and vent dysynchrony   Labs are pending Mechanical ventilation via ARDS protocol, target PRVC 6 cc/kg Wean PEEP and FiO2 as able Goal plateau pressure less than 30, driving pressure less than 15 Paralytics if necessary for vent synchrony, gas exchange Cycle prone positioning if necessary for oxygenation Deep sedation per PAD protocol, goal RASS -4, currently fentanyl, versed infusions Diuresis as blood pressure and renal function can tolerate, goal CVP 5-8 VAP prevention order set  Septic shock Likely secondary  to post Covid pneumonia, high risk HCAP Broad-spectrum antibiotics as ordered. He is completing a course cefepime / vanco [place]. Should be able to stop the vanco since his MRSA screen is negative. 5/24 will be day 7 cefepime.  Follow blood and respiratory cultures to completion, tailor antibiotics accordingly Epinephrine still running, wean this and Levophed as able.   Continue vasopressin Repeat lactic acid now   Nonoliguric renal failure requiring CRRT Hyperkalemia Anion gap metabolic acidosis, lactic acidosis CVVH initiated, appreciate nephrology management Check repeat lactic acid now Volume removal when his hemodynamics will allow.  Will likely need to keep even for now.  May require more volume depending on CVP and lactate  LUL pulmonary embolism Was on heparin, then apixiban, then changed back to heparin when he developed renal failure. 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 Pain/Anxiety/Delirium protocol (if indicated): Fentanyl Versed VAP protocol (if indicated): HOB 30 degrees  DVT prophylaxis: Heparin drip >> to argatroban GI prophylaxis: Protonix Glucose control: SSI Mobility: Bedrest Code Status: No ACLS or defib, full medical care Family Communication: Updated wife Enid Derry on 5/23 Disposition: ICU  Labs   CBC: Recent Labs  Lab 11/18/19 0004 11/18/19 0157 11/18/19 0349 11/18/19 0453 11/18/19 0557  WBC  --   --   --  27.9*  --   HGB 9.5* 9.2* 9.5* 8.4* 9.2*  HCT 28.0* 27.0* 28.0* 25.2* 27.0*  MCV  --   --   --  86.3  --   PLT  --   --   --  82*  --     Basic Metabolic Panel: Recent Labs  Lab 11/18/19 0004 11/18/19 0157 11/18/19 0349 11/18/19 0453 11/18/19 0557  NA 134* 132* 134* 144 135  K 5.4* 5.9* 5.5* 5.6* 5.3*  CL  --   --   --  103  --   CO2  --   --   --  19*  --   GLUCOSE   --   --   --  153*  --   BUN  --   --   --  83*  --   CREATININE  --   --   --  4.40*  --   CALCIUM  --   --   --  6.7*  --   MG  --   --   --  1.8  --   PHOS  --   --   --  7.6*  --    GFR: Estimated Creatinine Clearance: 17.6 mL/min (A) (by C-G formula based on SCr of 4.4 mg/dL (H)). Recent Labs  Lab 10/27/2019 0139 11/18/19 0453  WBC  --  27.9*  LATICACIDVEN 7.4*  --     Liver Function Tests: Recent Labs  Lab 11/18/19 0453  ALBUMIN 2.1*   No results for input(s): LIPASE, AMYLASE in the last 168 hours. No results for input(s): AMMONIA in the last 168 hours.  ABG    Component Value Date/Time   PHART 7.299 (L) 11/18/2019  0557   PCO2ART 47.5 11/18/2019 0557   PO2ART 74 (L) 11/18/2019 0557   HCO3 23.6 11/18/2019 0557   TCO2 25 11/18/2019 0557   ACIDBASEDEF 3.0 (H) 11/18/2019 0557   O2SAT 94.0 11/18/2019 0557     Coagulation Profile: No results for input(s): INR, PROTIME in the last 168 hours.  Cardiac Enzymes: No results for input(s): CKTOTAL, CKMB, CKMBINDEX, TROPONINI in the last 168 hours.  HbA1C: Hgb A1c MFr Bld  Date/Time Value Ref Range Status  11/18/2019 04:53 AM 6.8 (H) 4.8 - 5.6 % Final    Comment:    (NOTE) Pre diabetes:          5.7%-6.4% Diabetes:              >6.4% Glycemic control for   <7.0% adults with diabetes     CBG: Recent Labs  Lab 11/20/2019 2302 11/18/19 0320 11/18/19 0421  GLUCAP 116* 147* 149*    Review of Systems:   Unable to obtain secondary to sedation  Past Medical History  He,  has no past medical history on file.   Surgical History      Social History   reports previous alcohol use.   Family History   His family history is not on file.   Allergies Not on File   Home Medications  Prior to Admission medications   Not on File     Critical care time: 35 minutes      CRITICAL CARE Performed by: Collene Gobble   Total critical care time: 35  minutes  Critical care time was exclusive of separately  billable procedures and treating other patients.  Critical care was necessary to treat or prevent imminent or life-threatening deterioration.  Critical care was time spent personally by me on the following activities: development of treatment plan with patient and/or surrogate as well as nursing, discussions with consultants, evaluation of patient's response to treatment, examination of patient, obtaining history from patient or surrogate, ordering and performing treatments and interventions, ordering and review of laboratory studies, ordering and review of radiographic studies, pulse oximetry and re-evaluation of patient's condition.  Baltazar Apo, MD, PhD 11/18/2019, 7:01 AM [facility] [phone] or if no answer [phone]

## 2019-11-18 NOTE — Progress Notes (Signed)
Patient arrived via stretcher to 3M08 from [place], [place]. Patient has a TL RT IJ CVC with epi@2mcg , levo@30 , vaso@0 .3, fent@20 , versed@5 , heparin@790  units. PIV in [place] has the heparin infusing, flushes with no blood return. Left IJ dialysis cath, heparin locked. Foley cath patent and draining cloudy, yellow, urine. OGT hooked up to LWS. ETT 8.0 22@teeth  with a subglottic hooked to Inter. LWS. Vent settings are 100%/12/20/590. RT at bedside. Elink notified of patients arrival. Ground team aware and are coming to bedside. No belongings. No family with patient. Skin assessment done with Delphia Grates, RN and skin is intact. Patient does not follow commands. Awaiting new orders. Continuing to monitor patient.

## 2019-11-18 NOTE — Progress Notes (Signed)
ABG results given to Dr. Lamonte Sakai. VT changed to 540 (8 cc's) from a VT of 410 (6 cc's).

## 2019-11-18 NOTE — H&P (Signed)
NAME:  Craig Allen, MRN:  [id number], DOB:  10/10/1950, LOS: 1 ADMISSION DATE:  11/22/2019, CONSULTATION DATE:  11/18/19 REFERRING MD:  Angelina Sheriff, CHIEF COMPLAINT:  Covid-19 PNA  Brief History   69 y.o. M transferred from [place], [place] with Covid-19 PNA, PE and renal failure. Pt had worsening sepsis and hypotension, so was transferred for CRRT  History of present illness   69 y.o. M with PMH of CLL on Ibrutinib, lymphoma, PE and anemia who presented to [facility] in [place], [place] for cough. Found to have Covid-19 as well as acute, non-occlusive PE of the LUL.  He was admitted and given Dexamethasone, Ceftriaxone and Azithromycin and apixaban.  His respiratory status continued to worsen and he was intubated on 5/18 and transferred to intensive care.  Pt's renal function also worsened and he was initiated on dialysis on 5/21, he became increasingly hypotensive and so transfer was requested for CRRT.  At some point was started on Vanc/Cefepime.   Pt arrived on Levophed, Vasopressin and Epinephrin.  Initial ABG 7.07/53/73/15 0.6  Past Medical History  CLL, Lymphoma, PE, anemia, RA  Significant Hospital Events   5/10 Presented to ED in [place], [place] 5/18 Intubated 5/21 HD initiated  5/22 Transfer to [place]:  nephrology  Procedures:  5/18 ETT 5/21 HD catheter  Significant Diagnostic Tests:  5/20 CTA chest>> nonocclusive PE LUL  Micro Data:  5/23 blood cultures x2>> 5/23 respiratory culture>>  Antimicrobials:  Azithromycin 5/10-? Ceftriaxone 5/10-? Vancomycin 5/23- Cefepime 5/23-  Interim history/subjective:  Blood pressure stable on arrival, able to titrate down pressor requirements.  Dyssynchronous with the vent despite adequate sedation.  Objective   Blood pressure 117/70, pulse (!) 115, temperature 98.3 F (36.8 C), temperature source Axillary, resp. rate (!) 27, height 5\' 8"  (1.727 m), weight 93.8 kg, SpO2 90 %.    Vent Mode: PRVC FiO2 (%):  [100 %] 100 %  Set Rate:  [20 bmp] 20 bmp Vt Set:  [540 mL] 540 mL PEEP:  [12 cmH20] 12 cmH20  No intake or output data in the 24 hours ending 11/18/19 0003 Filed Weights   11/20/2019 2327  Weight: 93.8 kg    General: Well-nourished male, sedated and intubated HEENT: MM pink/moist, ETT in place Neuro: Pupils 3 mm and responsive to light, breathing over the vent, unresponsive to pain CV: s1s2 regular rate and rhythm, no m/r/g PULM: Coarse breath sounds throughout, tachypneic dyssynchronous with the vent GI: soft, bsx4 active  Extremities: warm/dry, no edema  Skin: no rashes or lesions   Resolved Hospital Problem list     Assessment & Plan:   Acute hypoxic respiratory failure secondary to Covid-19 pneumonia/ARDS Decompensating clinical status since admission on 5/10 now with significant acidosis and vent dysynchrony   Labs are pending -Start Nimbex for paralysis, sedation and analgesia with fentanyl and Versed -Patient completed course of dexamethasone, never received remdesivir -Check Echo -Maintain full vent support with SAT/SBT as tolerated -titrate Vent setting to maintain SpO2 greater than or equal to 90%. -HOB elevated 30 degrees. -Plateau pressures less than 30 cm H20.  -Follow chest x-ray, ABG prn.   -Bronchial hygiene and RT/bronchodilator protocol.    Septic shock Likely secondary to post Covid pneumonia -Continue broad empiric coverage with vancomycin and cefepime -Check blood and respiratory cultures -On Levophed, vasopressin and epinephrine drips on arrival,  titrating down as able -UA done 5/22 at [facility] not consistent with infection   Nonoliguric renal failure requiring CRRT shock and hypotension Seen by  nephrology and initiating CRRT overnight -Follow electrolytes    LUL pulmonary embolism Nonocclusive, initially on apixaban transition to heparin GTT -Continue heparin drip, check echocardiogram   CLL Diagnosed 2017, 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 Pain/Anxiety/Delirium protocol (if indicated): Fentanyl Versed VAP protocol (if indicated): HOB 30 degrees  DVT prophylaxis: Heparin drip GI prophylaxis: Protonix Glucose control: SSI Mobility: Bedrest Code Status: Full code Family Communication: Wife updated by phone, guarded prognosis discussed Disposition:   Labs   CBC: No results for input(s): WBC, NEUTROABS, HGB, HCT, MCV, PLT in the last 168 hours.  Basic Metabolic Panel: No results for input(s): NA, K, CL, CO2, GLUCOSE, BUN, CREATININE, CALCIUM, MG, PHOS in the last 168 hours. GFR: CrCl cannot be calculated (No successful lab value found.). No results for input(s): PROCALCITON, WBC, LATICACIDVEN in the last 168 hours.  Liver Function Tests: No results for input(s): AST, ALT, ALKPHOS, BILITOT, PROT, ALBUMIN in the last 168 hours. No results for input(s): LIPASE, AMYLASE in the last 168 hours. No results for input(s): AMMONIA in the last 168 hours.  ABG No results found for: PHART, PCO2ART, PO2ART, HCO3, TCO2, ACIDBASEDEF, O2SAT   Coagulation Profile: No results for input(s): INR, PROTIME in the last 168 hours.  Cardiac Enzymes: No results for input(s): CKTOTAL, CKMB, CKMBINDEX, TROPONINI in the last 168 hours.  HbA1C: No results found for: HGBA1C  CBG: Recent Labs  Lab 11/10/2019 2302  GLUCAP 116*    Review of Systems:   Unable to obtain secondary to sedation  Past Medical History  He,  has no past medical history on file.   Surgical History      Social History      Family History   His family history is not on file.   Allergies Not on File   Home Medications  Prior to Admission medications   Not on File     Critical care time: 65 minutes      CRITICAL CARE Performed by: Otilio Carpen Gleason   Total critical care time: 65 minutes  Critical care time was exclusive of separately billable procedures and  treating other patients.  Critical care was necessary to treat or prevent imminent or life-threatening deterioration.  Critical care was time spent personally by me on the following activities: development of treatment plan with patient and/or surrogate as well as nursing, discussions with consultants, evaluation of patient's response to treatment, examination of patient, obtaining history from patient or surrogate, ordering and performing treatments and interventions, ordering and review of laboratory studies, ordering and review of radiographic studies, pulse oximetry and re-evaluation of patient's condition.  Otilio Carpen Gleason, PA-C

## 2019-11-18 NOTE — Progress Notes (Signed)
UNABLE to replace Mag 1.8 per protocol d/t renal failure

## 2019-11-18 NOTE — Progress Notes (Signed)
eLink Physician-Brief Progress Note Patient Name: Craig Allen DOB: 10/10/1950 MRN: [id number]   Date of Service  11/18/2019  HPI/Events of Note  Nursing unable to wean Epinephrine IV infusion off. No order for Epinephrine IV infusion.   eICU Interventions  Will order: 1. Epinephrine IV infusion. Titrate to MAP >= 65.     Intervention Category Major Interventions: Hypotension - evaluation and management  Jolly Bleicher Cornelia Copa 11/18/2019, 5:57 AM

## 2019-11-18 NOTE — Progress Notes (Signed)
Pharmacy Antibiotic Note  Craig Allen is a 69 y.o. male admitted on 11/25/2019 with pneumonia.  Pharmacy has been consulted for cefepime and vancomycin dosing.  Transfer from [facility] - had been found to have COVID 19 and acute, non-occlusive PE of LUL. Was on iHD on 5/22 at OSH then transferred for CRRT due to hypotension. Confirmed with OSH -  cefepime LD 5/21@1958 , vanc LD 5/20@2018 , no remdesivir received, on ceftriaxone/azithromycin prior to vanc/cefepime.   Started on CRRT. Afebrile. Requiring multiple pressors. WBC 27.9. LA 7.4.  Plan: Cefepime 2 g IV every 12 hours  Will order vancomycin random to determine vancomycin dosing Monitor renal plans, cx results, clinical pic, and vanc levels as appropriate   Height: 5\' 8"  (172.7 cm) Weight: 93.8 kg (206 lb 12.7 oz) IBW/kg (Calculated) : 68.4  Temp (24hrs), Avg:98.8 F (37.1 C), Min:98.3 F (36.8 C), Max:99.3 F (37.4 C)  No results for input(s): WBC, CREATININE, LATICACIDVEN, VANCOTROUGH, VANCOPEAK, VANCORANDOM, GENTTROUGH, GENTPEAK, GENTRANDOM, TOBRATROUGH, TOBRAPEAK, TOBRARND, AMIKACINPEAK, AMIKACINTROU, AMIKACIN in the last 168 hours.  CrCl cannot be calculated (No successful lab value found.).    Not on File  Antimicrobials this admission: Vancomycin 5/23 >>  Cefepime 5/23 >>   Dose adjustments this admission: N/A  Microbiology results: 5/23 BCx: sent 5/23 MRSA PCR: sent 5/23 Trach aspirate: sent  Thank you for allowing pharmacy to be a part of this patient's care.  Antonietta Jewel, PharmD, BCCCP Clinical Pharmacist  11/18/2019 12:43 AM  Please check AMION for all [place] phone numbers After 10:00 PM, call [place] [phone]

## 2019-11-18 NOTE — Progress Notes (Signed)
eLink Physician-Brief Progress Note Patient Name: Craig Allen DOB: 10/10/1950 MRN: [id number]   Date of Service  11/18/2019  HPI/Events of Note  ABG on 100%/PRVC 24/TV 540/P 12 = 7.04/61.9/52 (flet to be venous)  eICU Interventions  Will order: 1. Increase PRVC rate to 35.  2. NaHCO3 100 meq IV X 1 now. 3. Repeat ABG at 3:30 AM.     Intervention Category Major Interventions: Acid-Base disturbance - evaluation and management;Respiratory failure - evaluation and management  Craig Allen 11/18/2019, 2:15 AM

## 2019-11-18 NOTE — Progress Notes (Signed)
ANTICOAGULATION CONSULT NOTE - Initial Consult  Pharmacy Consult for Argatroban (discontinuing IV Heparin Indication: pulmonary embolus - suspected HIT  Allergies  Allergen Reactions  . Heparin     Suspected HIT - labs inprocess    Patient Measurements: Height: 5\' 8"  (172.7 cm) Weight: 93.8 kg (206 lb 12.7 oz) IBW/kg (Calculated) : 68.4 Heparin Dosing Weight: 88 kg   Vital Signs: Temp: 97.2 F (36.2 C) (05/23 1600) Temp Source: Core (05/23 1520) BP: 137/76 (05/23 1619) Pulse Rate: 112 (05/23 1600)  Labs: Recent Labs    11/18/19 0349 11/18/19 0349 11/18/19 0453 11/18/19 0557 11/18/19 1214 11/18/19 1548  HGB 9.5*   < > 8.4* 9.2*  --   --   HCT 28.0*  --  25.2* 27.0*  --   --   PLT  --   --  82*  --   --   --   APTT  --   --  87*  --  93* 78*  HEPARINUNFRC  --   --  0.60  --   --   --   CREATININE  --   --  4.40*  --   --  3.44*   < > = values in this interval not displayed.    Estimated Creatinine Clearance: 22.5 mL/min (A) (by C-G formula based on SCr of 3.44 mg/dL (H)).  Assessment: 7 yom transferred from [facility] for CRRT initiation after found to have COVID 19 and acute, non-occlusive PE of LUL. Was started on apixaban (LD 5/18) then transitioned to heparin infusion (running at 800 units/hr on arrival).   Earlier this am concern for HIT - converted to argatroban   APTT this evening was 78 - within goal  Goal of Therapy:  aPTT 50-90 seconds Monitor platelets by anticoagulation protocol: Yes   Plan:  Continue Argatroban 0.4 mcg/kg/min.  Recheck aPTT at [place], PharmD, BCPS, BCCCP Clinical Pharmacist [phone]  Please check AMION for all [place] numbers  11/18/2019 7:45 PM   7:58 PM Addendum:  Repeat aPTT 73 - remains within goal  Plan: Continue current dose  Recheck aPTT in am

## 2019-11-19 ENCOUNTER — Inpatient Hospital Stay (HOSPITAL_COMMUNITY): Payer: Medicare Other

## 2019-11-19 DIAGNOSIS — U071 COVID-19: Secondary | ICD-10-CM

## 2019-11-19 DIAGNOSIS — J9600 Acute respiratory failure, unspecified whether with hypoxia or hypercapnia: Secondary | ICD-10-CM

## 2019-11-19 LAB — LACTIC ACID, PLASMA
Lactic Acid, Venous: >11 mmol/L (ref 0.5–1.9)
Lactic Acid, Venous: >11 mmol/L (ref 0.5–1.9)

## 2019-11-19 LAB — POCT I-STAT 7, (LYTES, BLD GAS, ICA,H+H)
Acid-base deficit: 6 mmol/L — ABNORMAL HIGH (ref 0.0–2.0)
Bicarbonate: 21 mmol/L (ref 20.0–28.0)
Calcium, Ion: 0.83 mmol/L — CL (ref 1.15–1.40)
HCT: 28 % — ABNORMAL LOW (ref 39.0–52.0)
Hemoglobin: 9.5 g/dL — ABNORMAL LOW (ref 13.0–17.0)
O2 Saturation: 91 %
Patient temperature: 98.3
Potassium: 4.8 mmol/L (ref 3.5–5.1)
Sodium: 132 mmol/L — ABNORMAL LOW (ref 135–145)
TCO2: 23 mmol/L (ref 22–32)
pCO2 arterial: 50.2 mmHg — ABNORMAL HIGH (ref 32.0–48.0)
pH, Arterial: 7.229 — ABNORMAL LOW (ref 7.350–7.450)
pO2, Arterial: 72 mmHg — ABNORMAL LOW (ref 83.0–108.0)

## 2019-11-19 LAB — HEPATIC FUNCTION PANEL
ALT: 510 U/L — ABNORMAL HIGH (ref 0–44)
AST: 427 U/L — ABNORMAL HIGH (ref 15–41)
Albumin: 2 g/dL — ABNORMAL LOW (ref 3.5–5.0)
Alkaline Phosphatase: 152 U/L — ABNORMAL HIGH (ref 38–126)
Bilirubin, Direct: 1 mg/dL — ABNORMAL HIGH (ref 0.0–0.2)
Indirect Bilirubin: 1.1 mg/dL — ABNORMAL HIGH (ref 0.3–0.9)
Total Bilirubin: 2.1 mg/dL — ABNORMAL HIGH (ref 0.3–1.2)
Total Protein: 4.3 g/dL — ABNORMAL LOW (ref 6.5–8.1)

## 2019-11-19 LAB — CBC
HCT: 24.6 % — ABNORMAL LOW (ref 39.0–52.0)
Hemoglobin: 7.9 g/dL — ABNORMAL LOW (ref 13.0–17.0)
MCH: 28.2 pg (ref 26.0–34.0)
MCHC: 32.1 g/dL (ref 30.0–36.0)
MCV: 87.9 fL (ref 80.0–100.0)
Platelets: DECREASED 10*3/uL (ref 150–400)
RBC: 2.8 MIL/uL — ABNORMAL LOW (ref 4.22–5.81)
RDW: 16.6 % — ABNORMAL HIGH (ref 11.5–15.5)
WBC: 18.2 10*3/uL — ABNORMAL HIGH (ref 4.0–10.5)
nRBC: 4.2 % — ABNORMAL HIGH (ref 0.0–0.2)

## 2019-11-19 LAB — CBC WITH DIFFERENTIAL/PLATELET
Abs Immature Granulocytes: 0.78 10*3/uL — ABNORMAL HIGH (ref 0.00–0.07)
Basophils Absolute: 0.1 10*3/uL (ref 0.0–0.1)
Basophils Relative: 0 %
Eosinophils Absolute: 0.1 10*3/uL (ref 0.0–0.5)
Eosinophils Relative: 0 %
HCT: 25.5 % — ABNORMAL LOW (ref 39.0–52.0)
Hemoglobin: 8.2 g/dL — ABNORMAL LOW (ref 13.0–17.0)
Immature Granulocytes: 4 %
Lymphocytes Relative: 8 %
Lymphs Abs: 1.4 10*3/uL (ref 0.7–4.0)
MCH: 27.8 pg (ref 26.0–34.0)
MCHC: 32.2 g/dL (ref 30.0–36.0)
MCV: 86.4 fL (ref 80.0–100.0)
Monocytes Absolute: 2.2 10*3/uL — ABNORMAL HIGH (ref 0.1–1.0)
Monocytes Relative: 12 %
Neutro Abs: 13.7 10*3/uL — ABNORMAL HIGH (ref 1.7–7.7)
Neutrophils Relative %: 76 %
Platelets: UNDETERMINED 10*3/uL (ref 150–400)
RBC: 2.95 MIL/uL — ABNORMAL LOW (ref 4.22–5.81)
RDW: 16.4 % — ABNORMAL HIGH (ref 11.5–15.5)
WBC: 18.1 10*3/uL — ABNORMAL HIGH (ref 4.0–10.5)
nRBC: 3.5 % — ABNORMAL HIGH (ref 0.0–0.2)

## 2019-11-19 LAB — RENAL FUNCTION PANEL
Albumin: 2 g/dL — ABNORMAL LOW (ref 3.5–5.0)
Anion gap: 25 — ABNORMAL HIGH (ref 5–15)
BUN: 49 mg/dL — ABNORMAL HIGH (ref 8–23)
CO2: 19 mmol/L — ABNORMAL LOW (ref 22–32)
Calcium: 6.1 mg/dL — CL (ref 8.9–10.3)
Chloride: 97 mmol/L — ABNORMAL LOW (ref 98–111)
Creatinine, Ser: 2.68 mg/dL — ABNORMAL HIGH (ref 0.61–1.24)
GFR calc Af Amer: 27 mL/min — ABNORMAL LOW (ref >60)
GFR calc non Af Amer: 23 mL/min — ABNORMAL LOW (ref >60)
Glucose, Bld: 61 mg/dL — ABNORMAL LOW (ref 70–99)
Phosphorus: 5.9 mg/dL — ABNORMAL HIGH (ref 2.5–4.6)
Potassium: 4.7 mmol/L (ref 3.5–5.1)
Sodium: 141 mmol/L (ref 135–145)

## 2019-11-19 LAB — GLUCOSE, CAPILLARY
Glucose-Capillary: 110 mg/dL — ABNORMAL HIGH (ref 70–99)
Glucose-Capillary: 196 mg/dL — ABNORMAL HIGH (ref 70–99)
Glucose-Capillary: 52 mg/dL — ABNORMAL LOW (ref 70–99)
Glucose-Capillary: 134 mg/dL — ABNORMAL HIGH (ref 70–99)
Glucose-Capillary: 19 mg/dL — CL (ref 70–99)

## 2019-11-19 LAB — MAGNESIUM: Magnesium: 2.1 mg/dL (ref 1.7–2.4)

## 2019-11-19 LAB — HEPARIN INDUCED PLATELET AB (HIT ANTIBODY): Heparin Induced Plt Ab: 0.051 OD (ref 0.000–0.400)

## 2019-11-19 LAB — APTT
aPTT: 77 seconds — ABNORMAL HIGH (ref 24–36)
aPTT: 80 seconds — ABNORMAL HIGH (ref 24–36)

## 2019-11-19 LAB — PATHOLOGIST SMEAR REVIEW

## 2019-11-19 MED ORDER — DEXTROSE 50 % IV SOLN
INTRAVENOUS | Status: AC
  Administered 2019-11-19: 50 mL via INTRAVENOUS
  Filled 2019-11-19: qty 50

## 2019-11-19 MED ORDER — DEXTROSE 50 % IV SOLN: 25.0000 g | Freq: Once | INTRAVENOUS | Status: AC

## 2019-11-19 MED ORDER — DEXTROSE 10 % IV SOLN: INTRAVENOUS | Status: DC

## 2019-11-19 MED ORDER — CALCIUM GLUCONATE-NACL 1-0.675 GM/50ML-% IV SOLN
1.0000 g | Freq: Once | INTRAVENOUS | Status: AC
  Administered 2019-11-19: 1000 mg via INTRAVENOUS
  Filled 2019-11-19: qty 50

## 2019-11-19 MED ORDER — HYDROCORTISONE NA SUCCINATE PF 100 MG IJ SOLR
50.0000 mg | Freq: Four times a day (QID) | INTRAMUSCULAR | Status: DC
  Administered 2019-11-19: 50 mg via INTRAVENOUS
  Filled 2019-11-19: qty 2

## 2019-11-19 MED ORDER — DEXTROSE 50 % IV SOLN
INTRAVENOUS | Status: AC
  Administered 2019-11-19: 50 mL
  Filled 2019-11-19: qty 50

## 2019-11-21 LAB — CULTURE, RESPIRATORY W GRAM STAIN

## 2019-11-23 LAB — CULTURE, BLOOD (ROUTINE X 2)
Culture: NO GROWTH
Culture: NO GROWTH

## 2019-11-27 NOTE — Progress Notes (Signed)
Patient passed. Vent turned off. MD notified.

## 2019-11-27 NOTE — Progress Notes (Signed)
Notified pharmacy BRB on new biopatch that was changed 11/18/19 @2000  on patients Lt IJ dialysis cath.  APTT drawn and is WNL. Will monitor site and notify elink if any more is noticed.

## 2019-11-27 NOTE — Progress Notes (Signed)
[facility] Progress Note   Subjective:filter clotted x 2 overnight, machine changed.   I/Os yesterday 3.8 / 1.8 (+2.1) . On NE, epi, naso.  Lactate > 11 this AM; ABG 7.23 / 50.  Hypoglycemia noted. Now running ok since machine changed out.  Max doses of vasopressors.  Primary has updated wife.    Vitals:   12-19-2019 0640 12-19-19 0650 2019/12/19 0700 12-19-19 0800  BP: 108/71 111/72 (!) 112/57 (!) 83/29  Pulse:      Resp: (!) 35 (!) 35 (!) 35 (!) 35  Temp: 98.6 F (37 C) 98.4 F (36.9 C) 98.2 F (36.8 C) (!) 97.3 F (36.3 C)  TempSrc:      SpO2:      Weight:      Height:        Exam: Gen on vent, sedated  Throat w ETT  Vent at 80% FiO2 Tachy on monitor Ext diffuse 2+ pitting edema of the legs and arms Neuro is sedated on the vent  UA 5/23 - >300 prot, large Hb, no wbc, 0-5 rbc  Assessment/ Plan: 1. Acute renal failure - baseline creat around 1.2.  Pt w/ hx of RA and CLL admitted for COVID pna about 12 days ago at outside hospital and has had progressive decline in renal function over the last week. SP 1 HD session there on 5/22 which was not successful due to shock. Pt deteriorated and was started on pressors and tx'd to [facility] 41M ICU.  CXR here showed diffuse infiltrates. ATN suspected as cause of AKI; CK in 250s, no significantly elevated. Pt had severe acidemia so was started on CRRT early am on 5/23.   Having issues with filter clotting overnight, seems to have improved after machine changed; on systemic argatroban.  Unable to perform UF due to severity of shock.  Fluid removal as tolerated.    2. COVID-19 PNA/ resp failure - on vent per CCM 3. Vol overload - per above 4. H/o RA 5. H/o CLL Situation seems fairly dire; primary discussing with wife. Cont with current level of care.  DNR. Jannifer Hick MD [facility] Pager [phone]   Recent Labs  Lab 11/18/19 1548 11/18/19 1631 2019-12-19 0240 2019/12/19 0338 2019-12-19 0624 Dec 19, 2019 0754   K 5.3*   < > 4.7  --   --  4.8  BUN 65*  --  49*  --   --   --   CREATININE 3.44*  --  2.68*  --   --   --   CALCIUM 6.6*  --  6.1*  --   --   --   PHOS 6.5*  --  5.9*  --   --   --   HGB  --    < >  --    < > 7.9* 9.5*   < > = values in this interval not displayed.   Inpatient medications: . artificial tears  1 application Both Eyes F7P  . chlorhexidine gluconate (MEDLINE KIT)  15 mL Mouth Rinse BID  . Chlorhexidine Gluconate Cloth  6 each Topical Q0600  . docusate  100 mg Oral BID  . insulin aspart  0-6 Units Subcutaneous Q4H  . mouth rinse  15 mL Mouth Rinse 10 times per day  . pantoprazole (PROTONIX) IV  40 mg Intravenous QHS  . polyethylene glycol  17 g Oral Daily  . sodium chloride flush  3 mL Intravenous Q12H   . sodium chloride 10 mL/hr at  11/18/19 2100  . argatroban 0.4 mcg/kg/min (2019/12/01 0800)  . ceFEPime (MAXIPIME) IV Stopped (2019/12/01 0227)  . cisatracurium (NIMBEX) infusion 7 mcg/kg/min (December 01, 2019 0800)  . dextrose 50 mL/hr at 12/01/19 0800  . epinephrine 20 mcg/min (Dec 01, 2019 0800)  . fentaNYL infusion INTRAVENOUS 100 mcg/hr (Dec 01, 2019 0800)  . midazolam 6 mg/hr (12/01/19 0800)  . norepinephrine (LEVOPHED) Adult infusion 40 mcg/min (December 01, 2019 0800)  . prismasol BGK 4/2.5 2,200 mL/hr at 12-01-2019 4730  . sodium bicarbonate (isotonic) 1000 mL infusion 300 mL/hr at Dec 01, 2019 8569  . sodium bicarbonate (isotonic) 1000 mL infusion 300 mL/hr at December 01, 2019 4370  . vasopressin (PITRESSIN) infusion - *FOR SHOCK* 0.03 Units/min (12/01/19 0800)   sodium chloride, alteplase, heparin, sodium chloride, sodium chloride flush

## 2019-11-27 NOTE — Progress Notes (Signed)
Initial Nutrition Assessment  DOCUMENTATION CODES:   Obesity unspecified  INTERVENTION:   -If unable to extubate, recommend:  Initiate Vital High Protein @ 40 ml/hr via OGT (960 ml/day)  60 ml Prostat BID.    Tube feeding regimen provides 1360 kcal (100% of needs), 144 grams of protein, and 803 ml of H2O.   NUTRITION DIAGNOSIS:   Inadequate oral intake related to inability to eat as evidenced by NPO status.  GOAL:   Provide needs based on ASPEN/SCCM guidelines  MONITOR:   Vent status, Labs, Weight trends, Skin, I & O's  REASON FOR ASSESSMENT:   Ventilator    ASSESSMENT:   69 y.o. M transferred from [place], [place] with Covid-19 PNA, PE and renal failure. Pt had worsening sepsis and hypotension, so was transferred for CRRT  Pt admitted with acute hypoxemic, hypercapnic respiratory failure requiring ventilation, ARDS, septic shock, and COVID-19 pneumonia.   Reviewed I/O's: +2.1 L x 24 hours and +2 L since admission  UOP: 75 ml x 24 hours  OGT output: 50 ml x 24 hours  Patient is currently intubated on ventilator support. OGT placement verified by x-ray; currently connected to low, intermittent suction.  MV: 17.8 L/min Temp (24hrs), Avg:97.4 F (36.3 C), Min:96.8 F (36 C), Max:98.8 F (37.1 C)  MAP: 41  Medications reviewed and include colace, agaratoban, nimbex, epinpehrine, dextrose 10% solution @ 50 ml/hr, versed, levophed, primasol. And pitressin.   Lab Results  Component Value Date   HGBA1C 6.8 (H) 11/18/2019   PTA DM medications are .   Labs reviewed: CBGS: 110-196 (inpatient orders for glycemic control are 0-6 units insulin aspart every 4 hours).   Diet Order:   Diet Order    None      EDUCATION NEEDS:   Not appropriate for education at this time  Skin:  Skin Assessment: Reviewed RN Assessment  Last BM:  2019-12-12  Height:   Ht Readings from Last 1 Encounters:  11/18/19 5\' 8"  (1.727 m)    Weight:   Wt Readings from Last 1  Encounters:  12/12/19 96.7 kg    Ideal Body Weight:  70 kg  BMI:  Body mass index is 32.41 kg/m.  Estimated Nutritional Needs:   Kcal:  [id number]  Protein:  > 140 grams  Fluid:  > 1.1 L    Loistine Chance, RD, LDN, [place] Registered Dietitian II Certified Diabetes Care and Education Specialist Please refer to [facility] for RD and/or RD on-call/weekend/after hours pager

## 2019-11-27 NOTE — Progress Notes (Signed)
eLink Physician-Brief Progress Note Patient Name: Macklyn Parpart DOB: Dec 09, 1950 MRN: [id number]   Date of Service  Nov 23, 2019  HPI/Events of Note  Hypocalcemia, corrected 7.7 In addition, RN notified me that patient has gone back on epinephrine infusion as well (was on it during the day as well - so continues to be on 3 pressors), also hypoglycemia +  eICU Interventions  Add D 10 Calcium gluconate x 1 Continue pressors Check a CBC  Overall poor prognosis RN reports no excessive bleeding, earlier had some oozing from around the HD cath (not reported at this time)      Intervention Category Major Interventions: Shock - evaluation and management Minor Interventions: Other:  Margaretmary Lombard 2019/11/23, 3:24 AM

## 2019-11-27 NOTE — Progress Notes (Signed)
Notified elink of having to start patient back on epi d/t drop in BP to 70-80's. Continuing to monitor patient

## 2019-11-27 NOTE — Progress Notes (Signed)
[place] for Argatroban Indication: pulmonary embolus - suspected HIT  Allergies  Allergen Reactions  . Heparin     Suspected HIT - labs inprocess    Patient Measurements: Height: 5\' 8"  (172.7 cm) Weight: 96.7 kg (213 lb 3 oz) IBW/kg (Calculated) : 68.4  Vital Signs: Temp: 98.2 F (36.8 C) (05/24 0700) Temp Source: Bladder (05/24 0400) BP: 112/57 (05/24 0700) Pulse Rate: 123 (05/24 0605)  Labs: Recent Labs    11/18/19 0453 11/18/19 0453 11/18/19 0557 11/18/19 0557 11/18/19 1214 11/18/19 1548 11/18/19 1548 11/18/19 1631 11/18/19 1937 11-22-2019 0014 11/22/2019 0240 Nov 22, 2019 0338  HGB 8.4*   < > 9.2*   < >  --   --   --  10.2*  --   --   --  8.2*  HCT 25.2*   < > 27.0*  --   --   --   --  30.0*  --   --   --  25.5*  PLT 82*  --   --   --   --   --   --   --   --   --   --  PLATELET CLUMPS NOTED ON SMEAR, UNABLE TO ESTIMATE  APTT 87*  --   --   --    < > 78*   < >  --  73* 77* 80*  --   HEPARINUNFRC 0.60  --   --   --   --   --   --   --   --   --   --   --   CREATININE 4.40*  --   --   --   --  3.44*  --   --   --   --  2.68*  --   CKTOTAL  --   --   --   --   --   --   --   --  258  --   --   --    < > = values in this interval not displayed.    Estimated Creatinine Clearance: 29.3 mL/min (A) (by C-G formula based on SCr of 2.68 mg/dL (H)).  Assessment: 4 yom transferred from [facility] for CRRT initiation after found to have COVID 19 and acute, non-occlusive LUL PE.  Patient finished Eliquis load, then was transitioned back to IV heparin due to worsening renal function.  He was then switched to argatroban on 11/18/19 due to concern for HIT.     APTT is therapeutic; no bleeding documented.  Goal of Therapy:  aPTT 50-90 seconds Monitor platelets by anticoagulation protocol: Yes   Plan:  Continue Argatroban at 0.4 mcg/kg/min Daily aPTT and CBC Monitor for bleeding F/u HIT Ab - sent 5/23  Nairi Oswald D. Mina Marble, PharmD, BCPS,  [place] 11/22/2019, 7:32 AM

## 2019-11-27 NOTE — Progress Notes (Signed)
Assisted tele visit to patient with family member.  Fendi Meinhardt M, RN  

## 2019-11-27 NOTE — Discharge Summary (Signed)
Physician Death Summary  Patient ID: Lynk Laberge MRN: [id number] DOB/AGE: 1950-12-01 69 y.o.  Admit date: 10/30/2019 Discharge date: 11/20/19  Admission Diagnoses: Acute respiratory failure secondary to COVID-19  Discharge Diagnoses:  Active Problems:   Acute renal failure (HCC)   Pneumonia due to COVID-19 virus   Acute respiratory failure with hypoxia (HCC)   Shock ([place])   Acute respiratory failure due to COVID-19 [facility]) ARDS  Discharged Condition: Deceased  Hospital Course:  68 y.o. M transferred from [place], [place] with Covid-19 PNA, PE and renal failure. Pt had worsening sepsis and hypotension, so was transferred for CRRT.  At [facility] he was continued on antibiotics, CRRT and paralytics.  He continued to deteriorate with worsening shock and was maxed out on multiple pressors including epinephrine.  CODE STATUS was DNR after discussion with family.  Updated family daily on patient's deteriorating clinical status and his wife was able to visit him.  He passed after PEA arrest on 5/24  Marshell Garfinkel MD [place] Please see [email or web address] for pager details.  11/25/2019, 6:28 AM

## 2019-11-27 NOTE — Progress Notes (Signed)
CRITICAL VALUE ALERT  Critical Value:  LA>11  Date & Time Notied:  12-15-2019 0327  Provider Notified: Warren Lacy  Orders Received/Actions taken: AWAITING NEW ORDERS

## 2019-11-27 NOTE — Progress Notes (Signed)
CRRT has been stopped since 0445 d/t "negative pressure alarm".  Filter changed twice and now machine changed. Catheter flushes and pulls back fine. Elink notified., Will let them know if this doesn't work

## 2019-11-27 NOTE — Progress Notes (Signed)
RT unable to obtain ABG at this time. ?

## 2019-11-27 NOTE — Progress Notes (Signed)
Assisted tele visit to patient with family member.  Severin Bou M, RN  

## 2019-11-27 NOTE — Progress Notes (Addendum)
NAME:  Craig Allen, MRN:  [id number], DOB:  Sep 29, 1950, LOS: 2 ADMISSION DATE:  11/14/2019, CONSULTATION DATE:  12-12-19 REFERRING MD:  Angelina Sheriff, CHIEF COMPLAINT:  Covid-19 PNA  Brief History   69 y.o. M transferred from [place], [place] with Covid-19 PNA, PE and renal failure. Pt had worsening sepsis and hypotension, so was transferred for CRRT  History of present illness   69 y.o. M with PMH of CLL on Ibrutinib, lymphoma, PE and anemia who presented to [facility] in [place], [place] for cough. Found to have Covid-19 as well as acute, non-occlusive PE of the LUL.  He was admitted and given Dexamethasone, Ceftriaxone and Azithromycin and apixaban.  His respiratory status continued to worsen and he was intubated on 5/18 and transferred to intensive care.  Pt's renal function also worsened and he was initiated on dialysis on 5/21, he became increasingly hypotensive and so transfer was requested for CRRT.  At some point was started on Vanc/Cefepime.   Pt arrived on Levophed, Vasopressin and Epinephrin.  Initial ABG 7.07/53/73/15 0.6  Past Medical History  CLL in ibrutinib, Lymphoma, PE, anemia, RA  Significant Hospital Events   5/10 Presented to ED in [place], [place] 5/18 Intubated 5/21 HD initiated  5/22 Transfer to [facility],  5/23 Started paralytic 5/24 Worsening shock, maxed on pressors  Consults:  nephrology  Procedures:  5/18 ETT 5/21 HD catheter  Significant Diagnostic Tests:  5/20 CTA chest>> nonocclusive PE LUL  Echo 5/23-LVEF 0000000, grade 1 diastolic dysfunction  Micro Data:  5/23 blood cultures x2>> 5/23 respiratory culture>> GPC's  Antimicrobials:  Azithromycin 5/10 >> 5/12 Ceftriaxone 5/10 >> 5/14  Vancomycin 5/18 >> 5/23 Cefepime 5/18 >>   Interim history/subjective:   Restarted epi drip.  Currently maxed out on pressors. Unable to do ultrafiltration due to hemodynamic instability Lactic acid greater than 11  Objective   Blood pressure (!) 86/53, pulse (!) 115,  temperature (!) 96.3 F (35.7 C), resp. rate (!) 35, height 5\' 8"  (1.727 m), weight 96.7 kg, SpO2 100 %.    Vent Mode: PRVC FiO2 (%):  [80 %-100 %] 80 % Set Rate:  [35 bmp] 35 bmp Vt Set:  [410 mL-540 mL] 540 mL PEEP:  [12 cmH20] 12 cmH20 Plateau Pressure:  [26 cmH20-37 cmH20] 29 cmH20   Intake/Output Summary (Last 24 hours) at 2019/12/12 1011 Last data filed at 2019-12-12 0900 Gross per 24 hour  Intake 4148.16 ml  Output 1590 ml  Net 2558.16 ml   Filed Weights   11/24/2019 2327 11/18/19 0415 2019-12-12 0147  Weight: 93.8 kg 93.8 kg 96.7 kg    Gen:      No acute distress HEENT:  EOMI, sclera anicteric Neck:     No masses; no thyromegaly, ETT Lungs:    Clear to auscultation bilaterally; normal respiratory effort CV:         Regular rate and rhythm; no murmurs Abd:      + bowel sounds; soft, non-tender; no palpable masses, no distension Ext:    No edema; adequate peripheral perfusion Skin:      Warm and dry; no rash Neuro: sedated, unresponsive  Labs significant for Creatinine 2.68, BUN 49, lactic acid greater than 11 WBC 18.3 Chest x-ray 5/24-improvement in bilateral airspace opacities.   Resolved Hospital Problem list     Assessment & Plan:   Acute hypoxic respiratory failure secondary to Covid-19 pneumonia/ARDS Completed dexamethasone 10 days, never received remdesivir Mechanical ventilation via ARDS protocol, target PRVC 6 cc/kg On Paralytics, Will  not prone due to significant hemodynamic instability Wean PEEP and FiO2 as able Goal plateau pressure less than 30, driving pressure less than 15 Deep sedation per PAD protocol, goal RASS -4, currently fentanyl, versed infusions  Septic shock, present on admission, unknown organism. Likely secondary to post Covid pneumonia, high risk HCAP Broad-spectrum antibiotics as ordered. He is completing a course cefepime from OSH.  Stopped vanco as MRSA PCR is negative. Start stress dose steroids as pt is on prednisone as  outpatient  Nonoliguric renal failure requiring CRRT Hyperkalemia Anion gap metabolic acidosis, lactic acidosis Continue CRRT.  LUL pulmonary embolism Was on heparin, then apixiban, then changed back to heparin when he developed renal failure. 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  She is aware of deteriorating clinical status and that he will likley pass away today.  She wants to keep going for now No CPR in case of cardiac arrest.  Best practice:  Diet: N.p.o. Pain/Anxiety/Delirium protocol (if indicated): Fentanyl Versed VAP protocol (if indicated): HOB 30 degrees  DVT prophylaxis: Heparin drip >> to argatroban GI prophylaxis: Protonix Glucose control: SSI Mobility: Bedrest Code Status: No ACLS or defib, full medical care Family Communication: Updated wife Craig Allen on 5/24 Disposition: ICU   Critical care time:    The patient is critically ill with multiple organ system failure and requires high complexity decision making for assessment and support, frequent evaluation and titration of therapies, advanced monitoring, review of radiographic studies and interpretation of complex data.   Critical Care Time devoted to patient care services, exclusive of separately billable procedures, described in this note is 45 minutes.   Marshell Garfinkel MD [facility] Please see [email or web address] for pager details.  December 06, 2019, 10:12 AM

## 2019-11-27 NOTE — Progress Notes (Signed)
Wife and MD notified of patients declining status and then again when patient deceased. Wife to come visit following COVID visitation guidelines.

## 2019-11-27 NOTE — Progress Notes (Signed)
CRRT on and machine running smooth for now. CBC and Lactic sent. Continuing to monitor patient.

## 2019-11-27 NOTE — Progress Notes (Signed)
CBG=70 @2339 , 1 amp D50 given and recheck CBG=136. Continuing to monitor.

## 2019-11-27 NOTE — Procedures (Signed)
Arterial Catheter Insertion Procedure Note Felice Collins [id number] 02/09/1951  Procedure: Insertion of Arterial Catheter  Indications: Blood pressure monitoring and Frequent blood sampling  Procedure Details Consent: Risks of procedure as well as the alternatives and risks of each were explained to the (patient/caregiver).  Consent for procedure obtained. Time Out: Verified patient identification, verified procedure, site/side was marked, verified correct patient position, special equipment/implants available, medications/allergies/relevent history reviewed, required imaging and test results available.  Performed  Maximum sterile technique was used including antiseptics, cap, gloves, gown, hand hygiene, mask and sheet. Skin prep: Chlorhexidine; local anesthetic administered 20 gauge catheter was inserted into right femoral artery using the Seldinger technique. ULTRASOUND GUIDANCE USED: YES Evaluation Blood flow good; BP tracing good. Complications: No apparent complications.   Kennieth Rad, MSN, AGACNP-BC [facility] 2019-12-02, 12:06 PM  See Amion for personal pager PCCM on call pager [phone]

## 2019-11-27 NOTE — Progress Notes (Signed)
CRITICAL VALUE ALERT  Critical Value:  Ca=6.1  Date & Time Notied:  2019/12/09 0316  Provider Notified: elink  Orders Received/Actions taken: awaiting new orders

## 2019-11-27 NOTE — Progress Notes (Signed)
Assisted tele visit to patient with family member.  Craig Allen M, RN  

## 2019-11-27 DEATH — deceased

## 2021-11-02 IMAGING — DX DG ABD PORTABLE 1V
1 series · 1 of 1 positions shown · non-contrast
Comparison: None.

CLINICAL DATA: Tube placement

EXAM:
PORTABLE ABDOMEN - 1 VIEW

[abdomen]
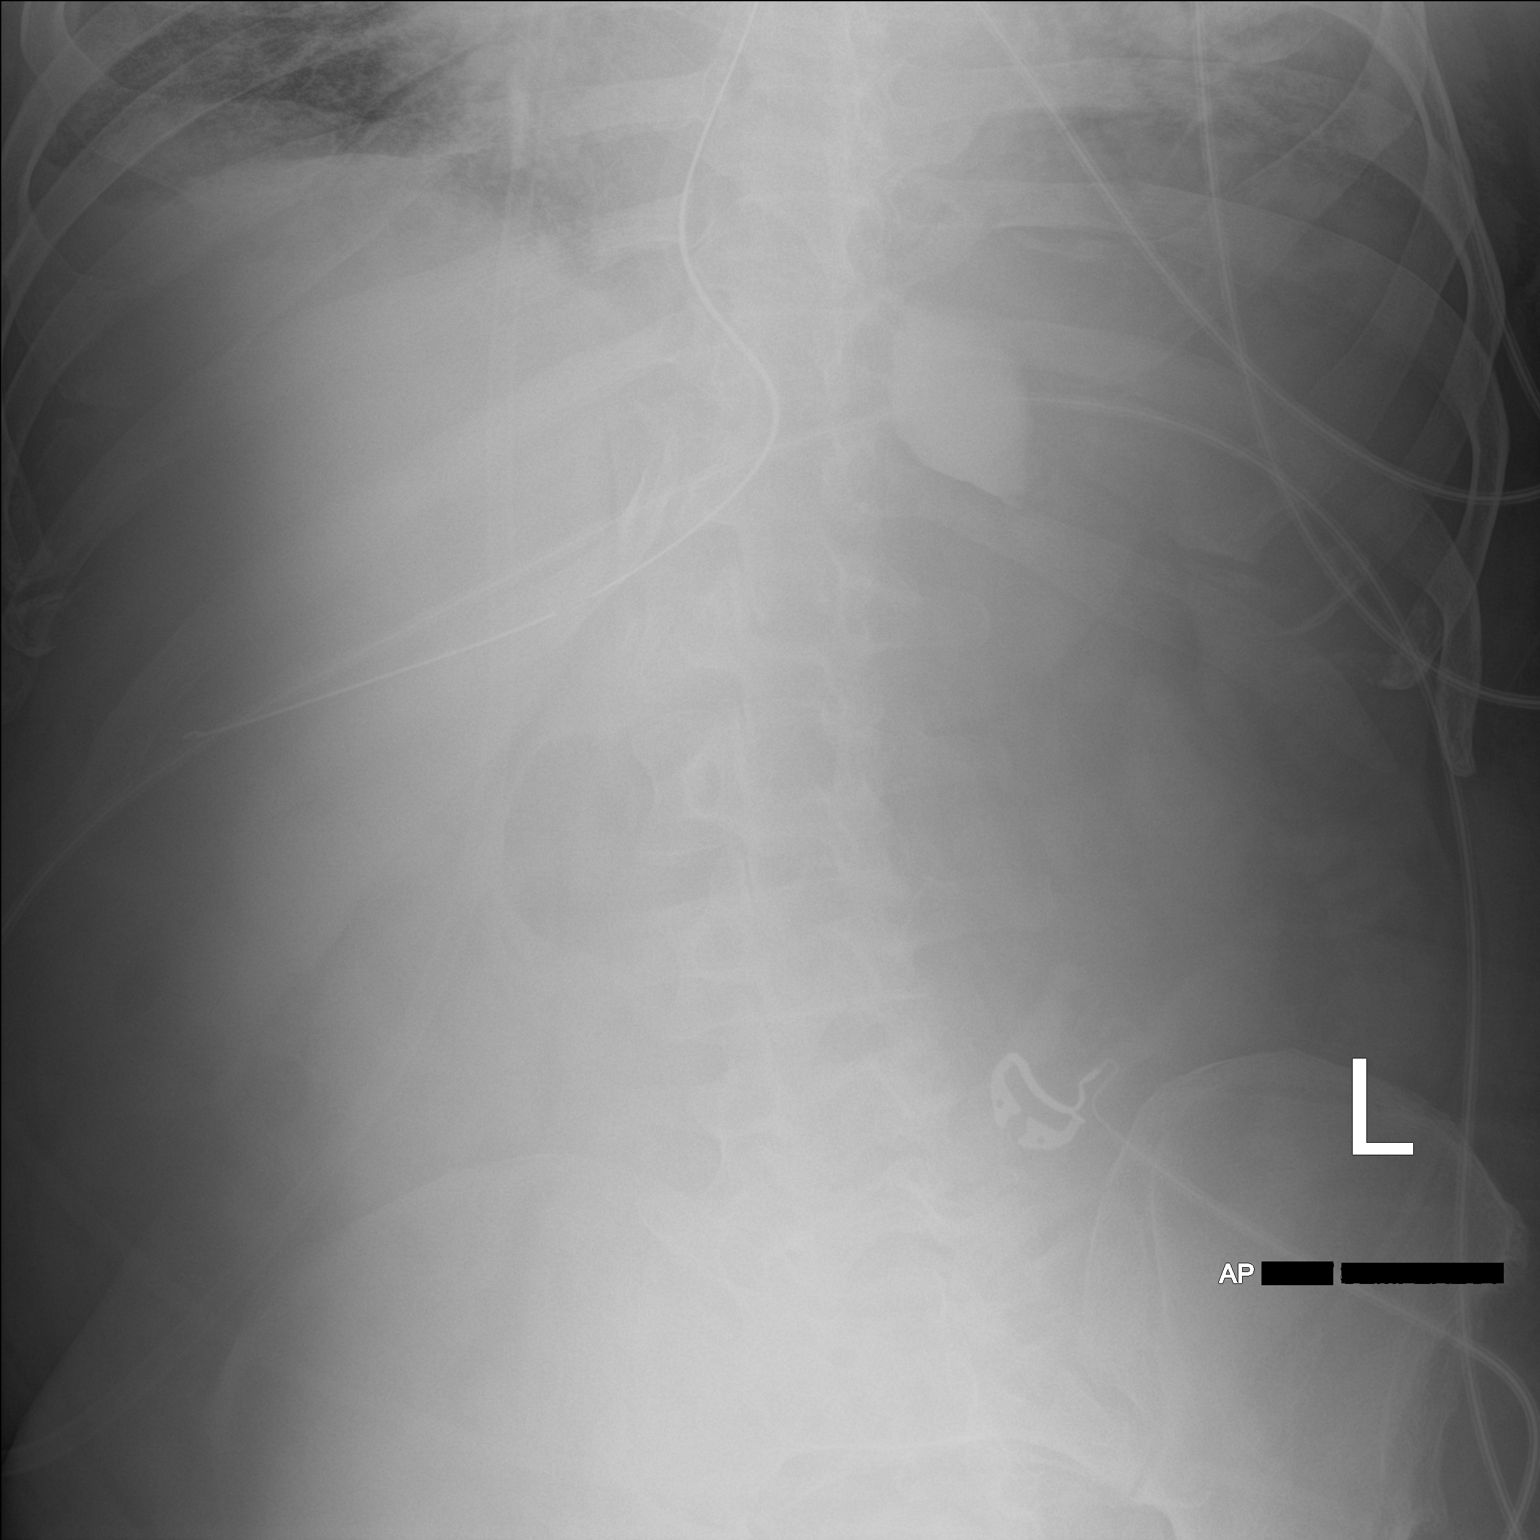

[1 of 1 positions shown; findings below may reference images not displayed]

FINDINGS: The OG tube projects over the gastric antrum/pylorus. The bowel gas
pattern is nonspecific. There is a 5 cm ovoid density projecting
over the left upper quadrant. There is no evidence for pneumatosis
or free air.
IMPRESSION: 1. OG tube projects over the gastric antrum/pylorus.
2. Nonspecific 5 cm ovoid density projecting over the left upper
quadrant. This may represent oral contrast but should be correlated
with the patient's history.

## 2021-11-03 IMAGING — DX DG CHEST 1V PORT
1 series · 1 of 1 positions shown · non-contrast
Comparison: None.

CLINICAL DATA: COVID pneumonia.

EXAM:
PORTABLE CHEST 1 VIEW

[chest]
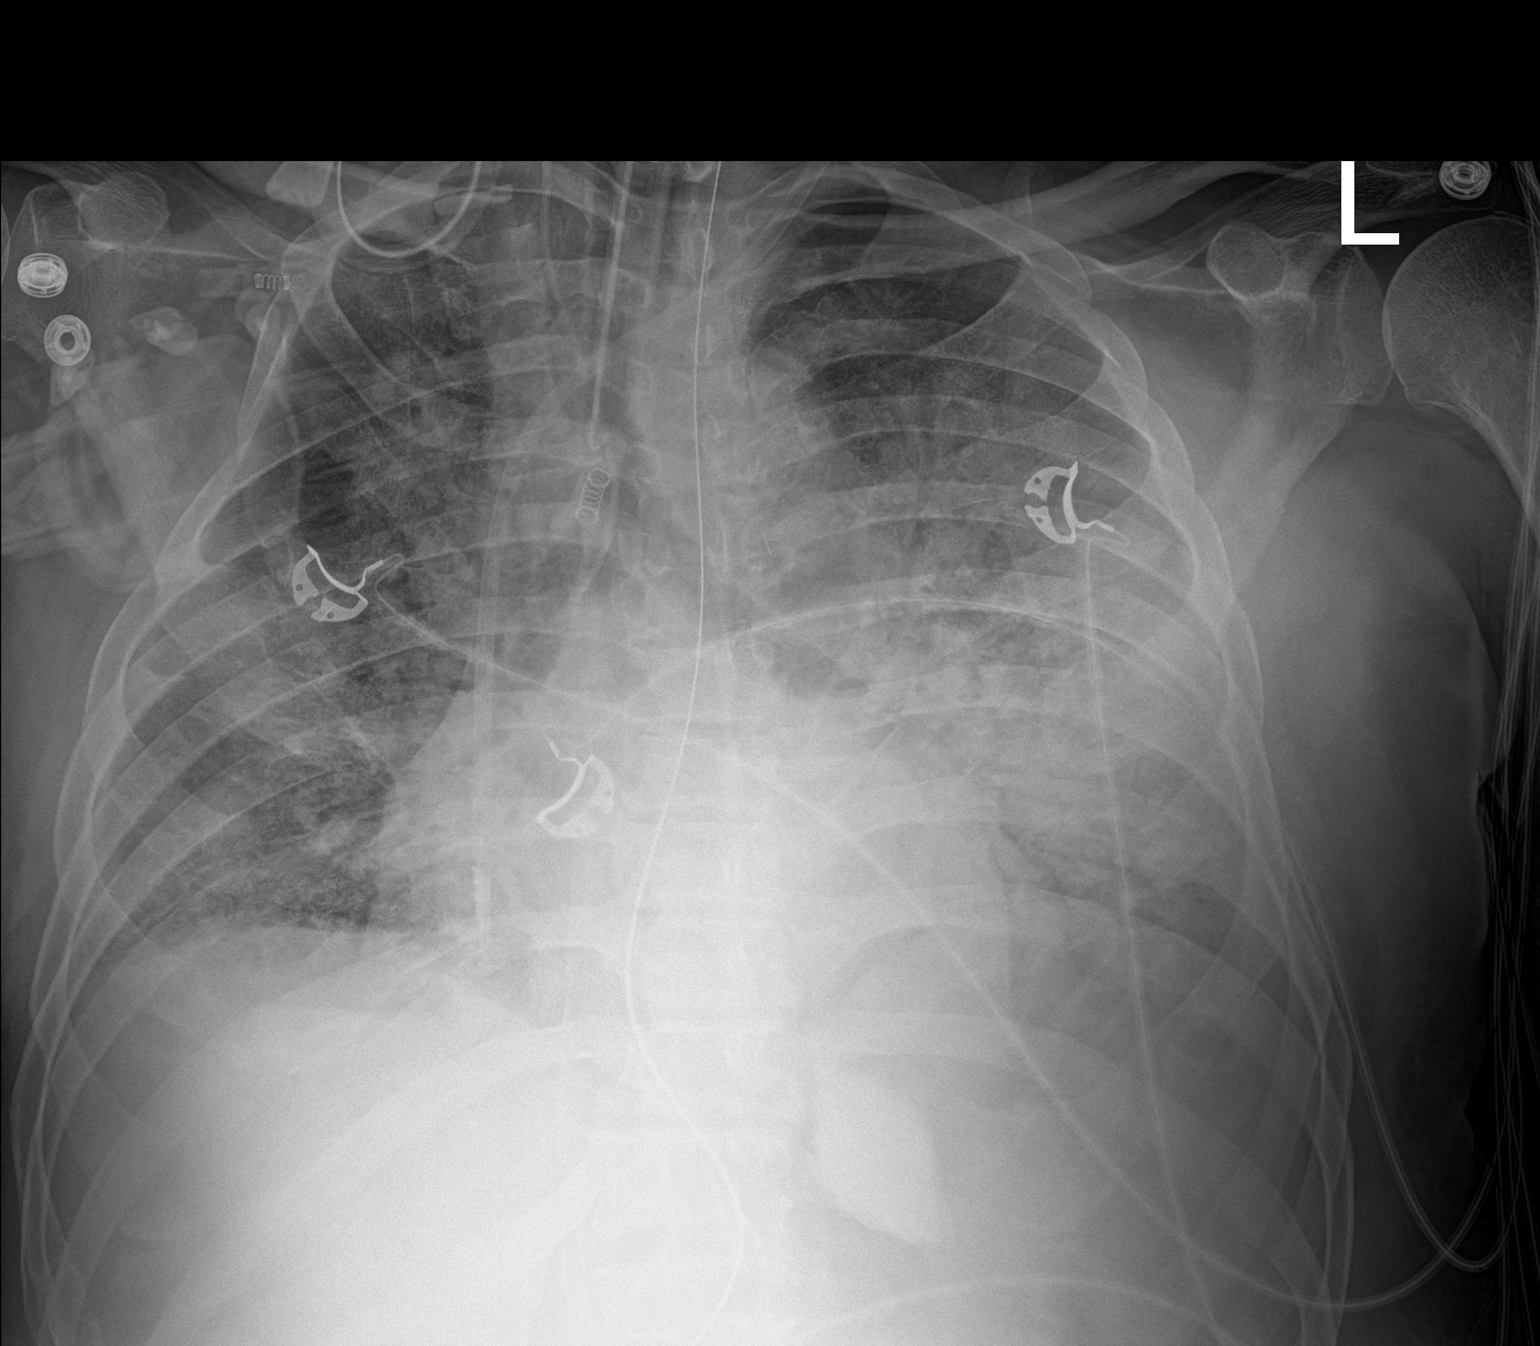

[1 of 1 positions shown; findings below may reference images not displayed]

FINDINGS: The endotracheal tube terminates approximately 2.6 cm above the
carina. The enteric tube extends below the left hemidiaphragm. There
are diffuse bilateral hazy airspace opacities consistent with the
patient's history of pneumonia. There is a left-sided central venous
catheter with tip projecting over the cavoatrial junction/high right
atrium. There is no left-sided pneumothorax. No right-sided
pneumothorax. There are probable small bilateral pleural effusions.
There is no acute osseous abnormality.
IMPRESSION: 1. Diffuse bilateral hazy airspace opacities consistent with the
patient's history of pneumonia.
2. Lines and tubes as above. No pneumothorax.
3. Probable small bilateral pleural effusions.

## 2021-11-03 IMAGING — US US RENAL
1 series · 14 of 25 positions shown · non-contrast
Comparison: None.

CLINICAL DATA: Acute renal injury

EXAM:
RENAL / URINARY TRACT ULTRASOUND COMPLETE

[Series 1: us renal · 14 of 27 slices shown]
[im 1/27]
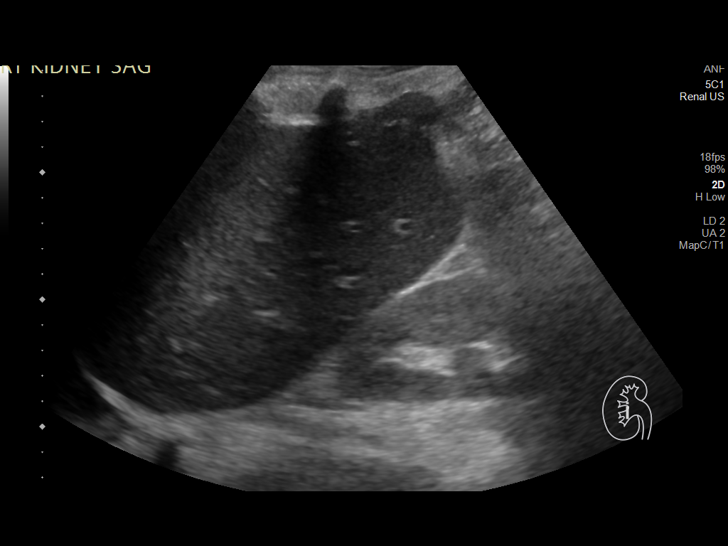
[im 3/27]
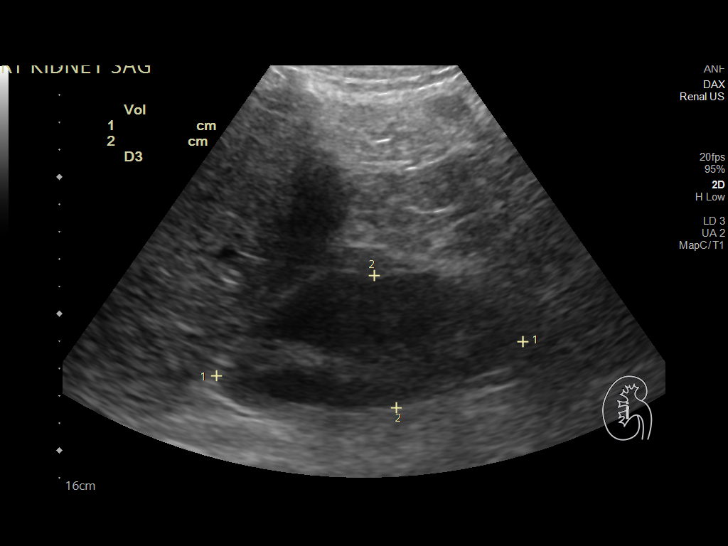
[im 5/27]
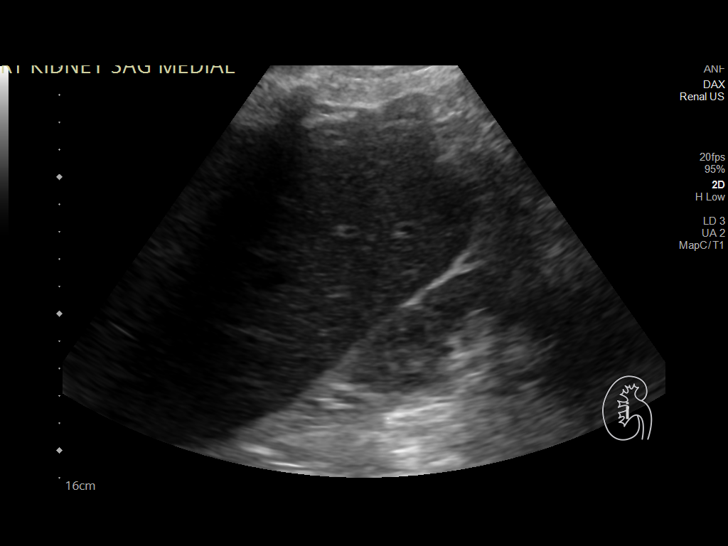
[im 7/27]
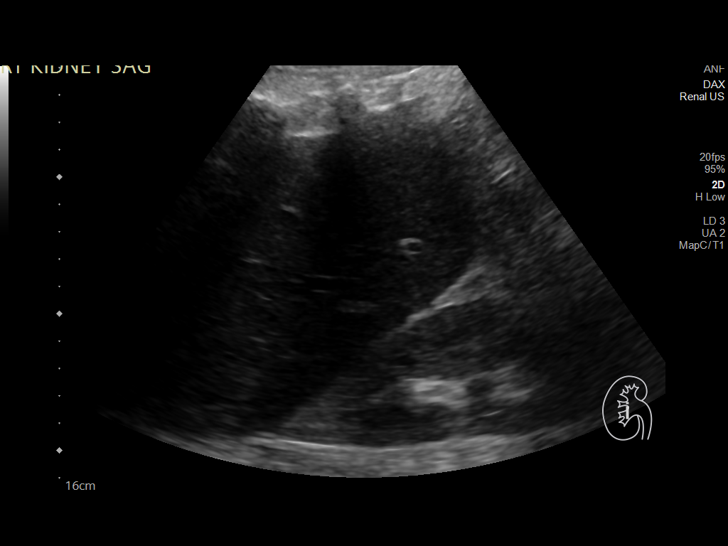
[im 9/27]
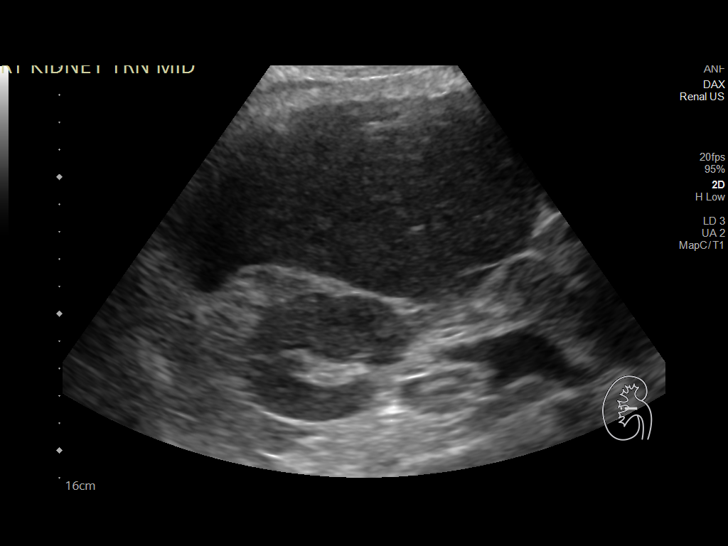
[im 10/27]
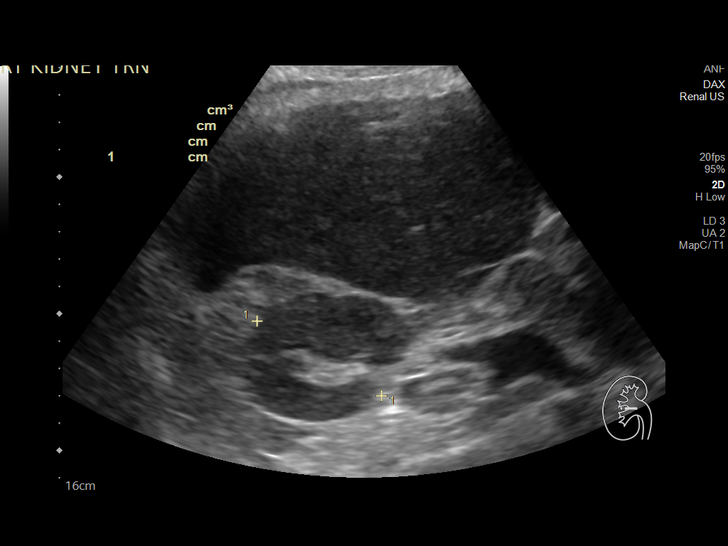
[im 12/27]
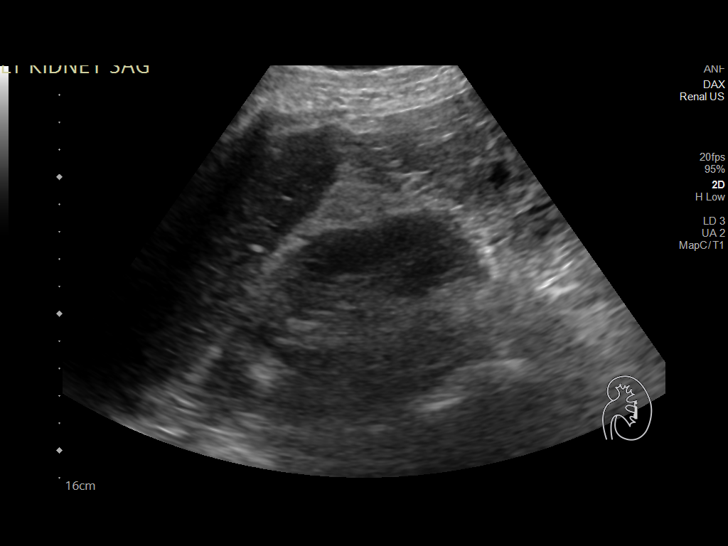
[im 15/27]
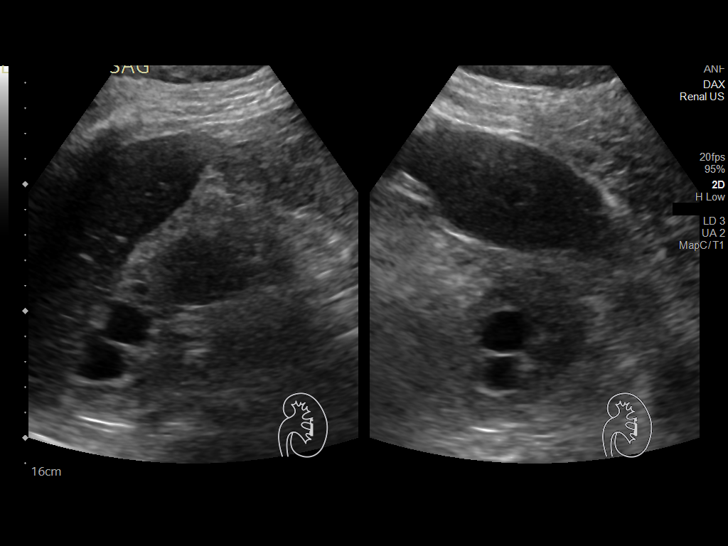
[im 17/27]
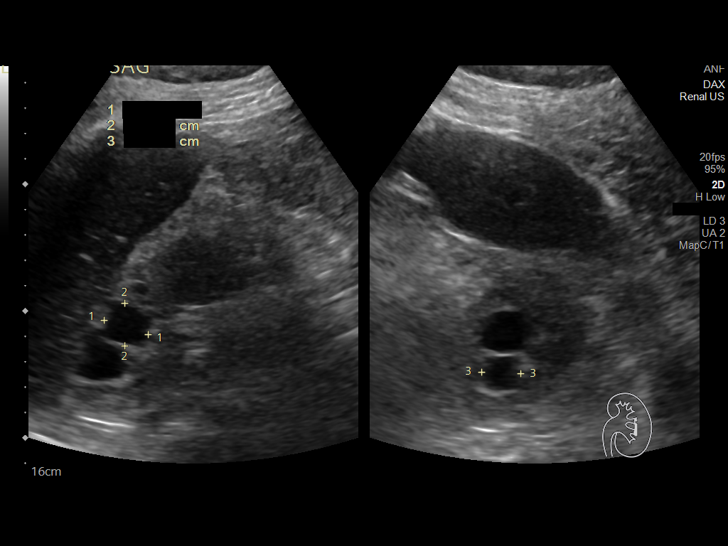
[im 18/27]
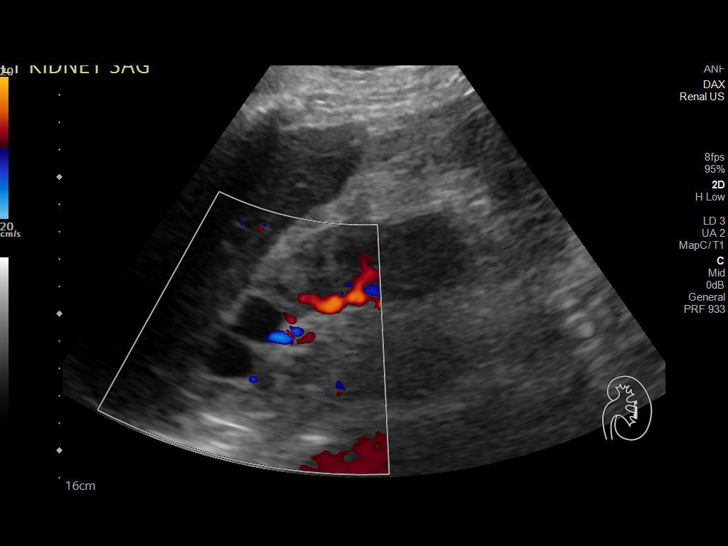
[im 20/27]
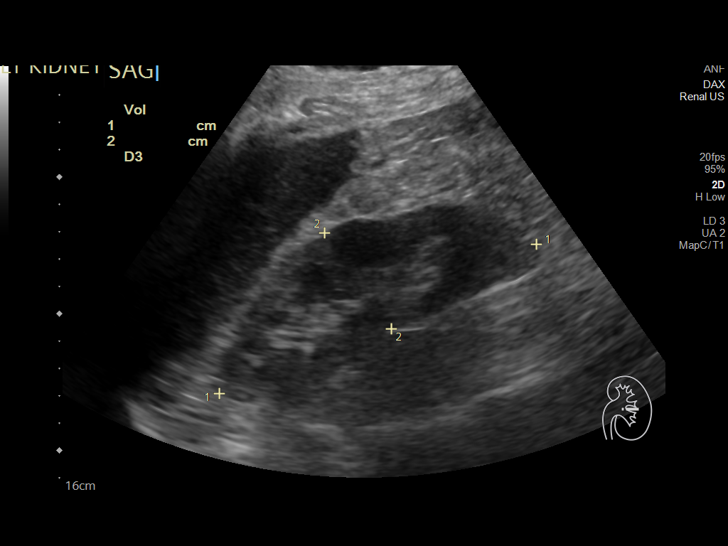
[im 22/27]
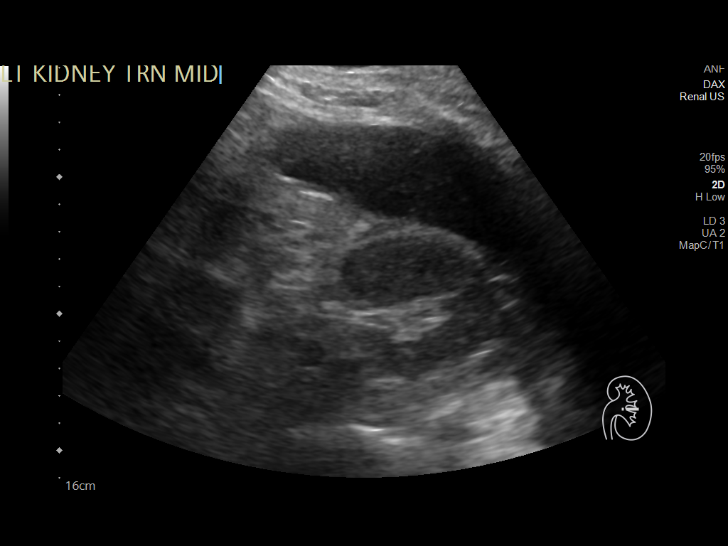
[im 24/27]
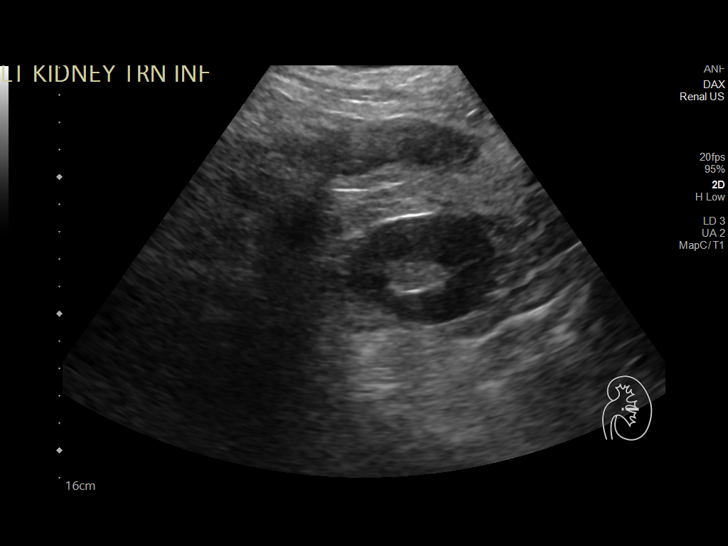
[im 27/27]
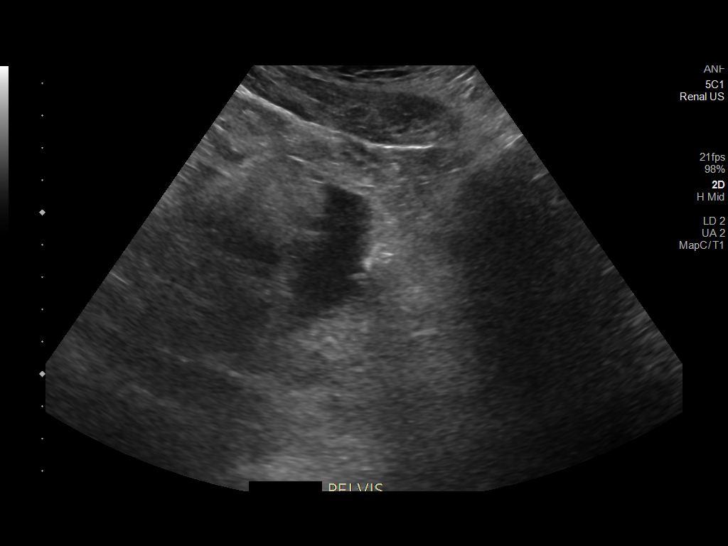

[14 of 25 positions shown; findings below may reference images not displayed]

FINDINGS: Right Kidney:

Renal measurements: 11.3 x 4.9 x 5.3 cm. = volume: 154 mL .
Echogenicity within normal limits. No mass or hydronephrosis
visualized.

Left Kidney:

Renal measurements: 12.8 x 4.3 x 5.4 cm = volume: 156 mL. Cysts are
noted within the left upper pole the largest of which measures
cm.

Bladder:

Decompressed by Foley catheter.

Other:

Minimal ascites is noted within the abdomen.
IMPRESSION: Left renal cysts.  No other renal abnormality is noted.

Minimal ascites.

## 2021-11-04 IMAGING — DX DG CHEST 1V PORT
1 series · 1 of 1 positions shown · non-contrast
Comparison: Radiograph 11/18/2019

CLINICAL DATA: Acute on chronic respiratory failure with hypoxia
and hypercapnia

EXAM:
PORTABLE CHEST 1 VIEW

[chest]
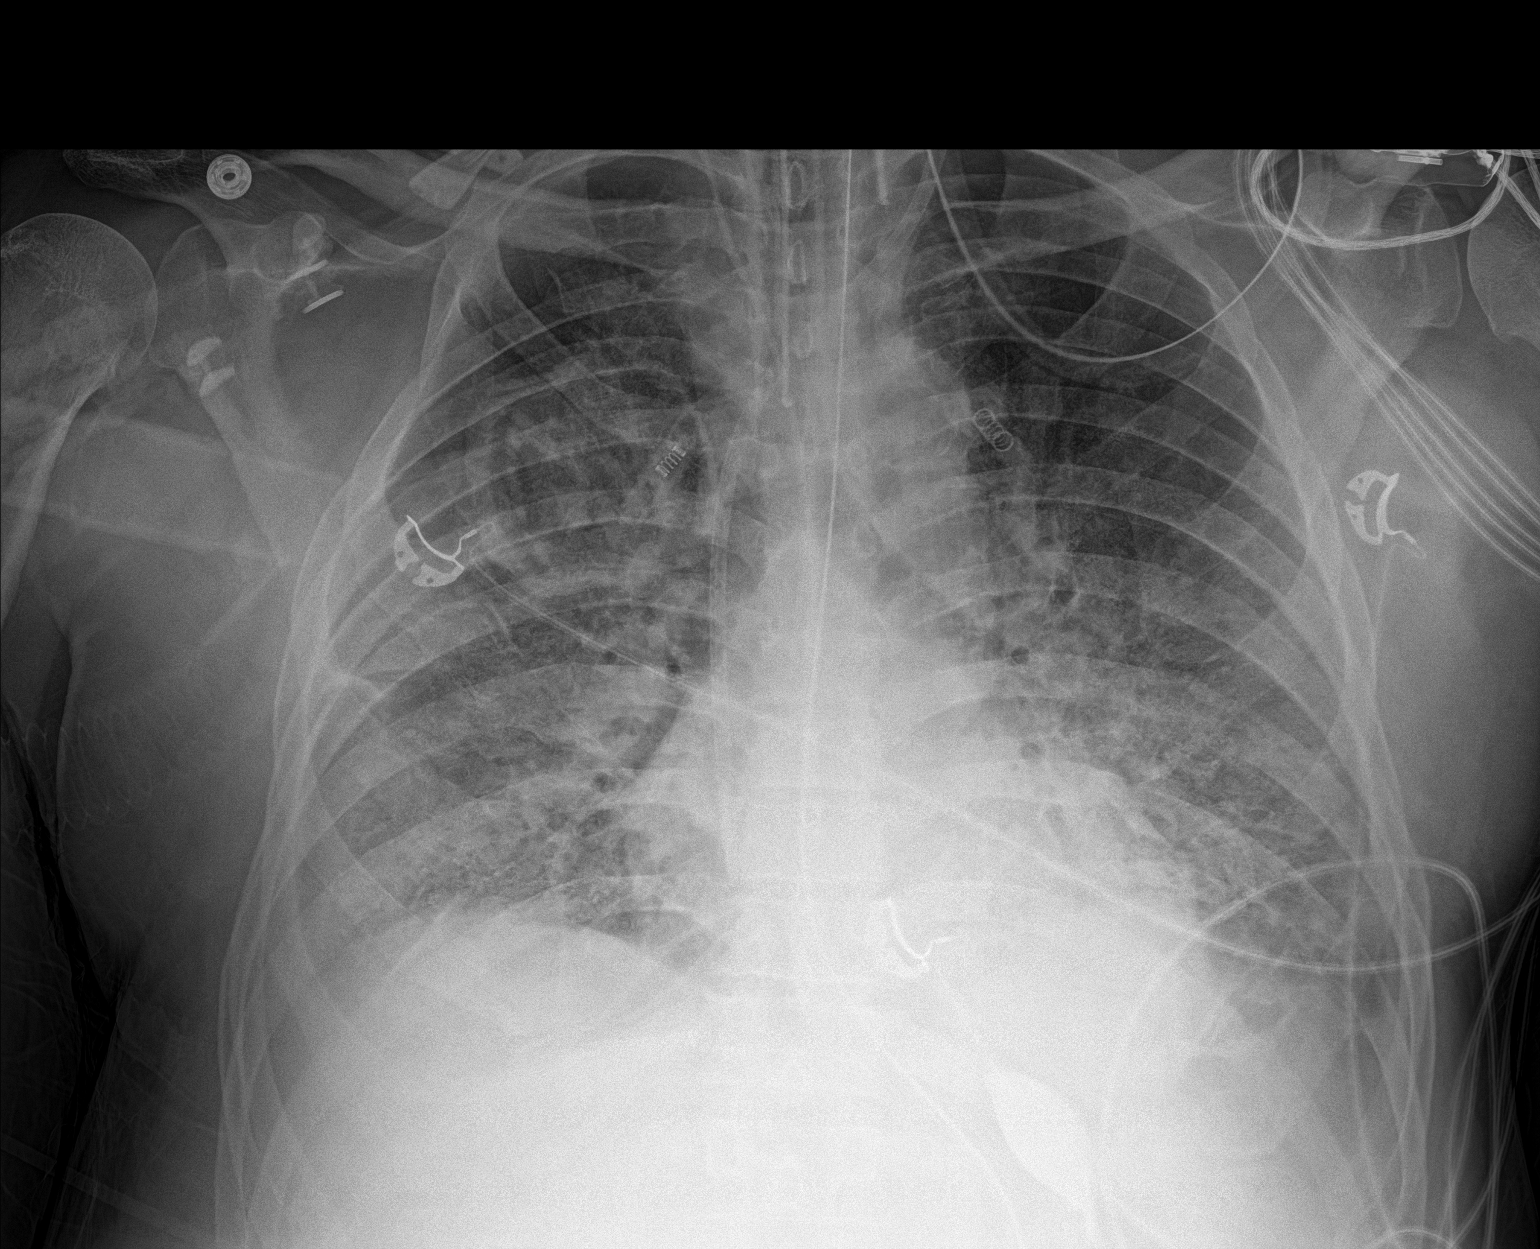

[1 of 1 positions shown; findings below may reference images not displayed]

FINDINGS: *Endotracheal tube in the mid trachea 3.5 cm from the carina.
*Transesophageal tube tip beyond the GE junction, below the level of
imaging.
*A right IJ approach catheter terminates in the lower SVC.
*Left IJ approach central venous catheter terminates at the right
atrium.
*Telemetry leads and additional support devices overlie the chest.

There are persistent diffuse bilateral mixed interstitial and
consolidative opacities which are somewhat improved from comparison
with particular clearing in the left mid to upper lung and in
overall improved lung aeration. No visible pneumothorax. Suspect
small bilateral effusions, right slightly greater than left. The
aorta is calcified. The remaining cardiomediastinal contours are
unremarkable. Chronic right chest wall deformity. No acute osseous
or soft tissue abnormality.
IMPRESSION: Diffuse bilateral mixed interstitial and consolidative opacities
compatible with pneumonia. Improved from comparison with clearing in
the left mid to upper lung. Overall improved lung aeration.

Lines and tubes as above.

Aortic Atherosclerosis ([id number].W).
